# Patient Record
Sex: Male | Born: 1935 | Race: White | Hispanic: No | Marital: Married | Smoking: Former smoker
Health system: Southern US, Community
[De-identification: ages and names within clinical notes are randomized; demographics above are authoritative.]

## PROBLEM LIST (undated history)

## (undated) DIAGNOSIS — N400 Enlarged prostate without lower urinary tract symptoms: Secondary | ICD-10-CM

## (undated) DIAGNOSIS — I2510 Atherosclerotic heart disease of native coronary artery without angina pectoris: Secondary | ICD-10-CM

## (undated) DIAGNOSIS — K279 Peptic ulcer, site unspecified, unspecified as acute or chronic, without hemorrhage or perforation: Secondary | ICD-10-CM

## (undated) DIAGNOSIS — M4800 Spinal stenosis, site unspecified: Secondary | ICD-10-CM

## (undated) DIAGNOSIS — C61 Malignant neoplasm of prostate: Secondary | ICD-10-CM

## (undated) DIAGNOSIS — E039 Hypothyroidism, unspecified: Secondary | ICD-10-CM

## (undated) DIAGNOSIS — E785 Hyperlipidemia, unspecified: Secondary | ICD-10-CM

## (undated) DIAGNOSIS — I10 Essential (primary) hypertension: Secondary | ICD-10-CM

## (undated) DIAGNOSIS — I219 Acute myocardial infarction, unspecified: Secondary | ICD-10-CM

## (undated) HISTORY — PX: CORONARY ARTERY BYPASS GRAFT: SHX141

## (undated) HISTORY — PX: ANGIOPLASTY: SHX39

## (undated) HISTORY — DX: Benign prostatic hyperplasia without lower urinary tract symptoms: N40.0

## (undated) HISTORY — DX: Atherosclerotic heart disease of native coronary artery without angina pectoris: I25.10

## (undated) HISTORY — PX: KNEE SURGERY: SHX244

## (undated) HISTORY — DX: Malignant neoplasm of prostate: C61

## (undated) HISTORY — PX: VAGOTOMY: SUR1431

## (undated) HISTORY — DX: Hyperlipidemia, unspecified: E78.5

## (undated) HISTORY — DX: Essential (primary) hypertension: I10

## (undated) HISTORY — PX: CORONARY ANGIOPLASTY WITH STENT PLACEMENT: SHX49

## (undated) HISTORY — PX: GASTROJEJUNOSTOMY: SHX1697

## (undated) HISTORY — DX: Peptic ulcer, site unspecified, unspecified as acute or chronic, without hemorrhage or perforation: K27.9

## (undated) HISTORY — DX: Spinal stenosis, site unspecified: M48.00

## (undated) HISTORY — PX: BYPASS GRAFT: SHX909

## (undated) HISTORY — DX: Hypothyroidism, unspecified: E03.9

---

## 2003-04-30 ENCOUNTER — Encounter: Payer: Self-pay | Admitting: Cardiology

## 2003-04-30 ENCOUNTER — Ambulatory Visit (HOSPITAL_COMMUNITY): Admission: RE | Admit: 2003-04-30 | Discharge: 2003-04-30 | Payer: Self-pay | Admitting: Cardiology

## 2003-05-07 ENCOUNTER — Ambulatory Visit (HOSPITAL_COMMUNITY): Admission: RE | Admit: 2003-05-07 | Discharge: 2003-05-08 | Payer: Self-pay | Admitting: Cardiology

## 2004-02-29 ENCOUNTER — Encounter: Admission: RE | Admit: 2004-02-29 | Discharge: 2004-03-31 | Payer: Self-pay | Admitting: Orthopedic Surgery

## 2004-03-24 ENCOUNTER — Encounter: Admission: RE | Admit: 2004-03-24 | Discharge: 2004-03-24 | Payer: Self-pay | Admitting: Orthopedic Surgery

## 2004-05-04 ENCOUNTER — Ambulatory Visit (HOSPITAL_COMMUNITY): Admission: RE | Admit: 2004-05-04 | Discharge: 2004-05-06 | Payer: Self-pay | Admitting: Orthopedic Surgery

## 2004-05-04 ENCOUNTER — Encounter: Admission: RE | Admit: 2004-05-04 | Discharge: 2004-05-04 | Payer: Self-pay | Admitting: Orthopedic Surgery

## 2004-05-06 ENCOUNTER — Ambulatory Visit (HOSPITAL_BASED_OUTPATIENT_CLINIC_OR_DEPARTMENT_OTHER): Admission: RE | Admit: 2004-05-06 | Discharge: 2004-05-06 | Payer: Self-pay | Admitting: Orthopedic Surgery

## 2004-06-09 ENCOUNTER — Encounter: Admission: RE | Admit: 2004-06-09 | Discharge: 2004-09-01 | Payer: Self-pay | Admitting: Orthopedic Surgery

## 2005-06-30 ENCOUNTER — Emergency Department (HOSPITAL_COMMUNITY): Admission: EM | Admit: 2005-06-30 | Discharge: 2005-06-30 | Payer: Self-pay | Admitting: Emergency Medicine

## 2006-05-17 ENCOUNTER — Encounter: Admission: RE | Admit: 2006-05-17 | Discharge: 2006-05-17 | Payer: Self-pay | Admitting: Cardiology

## 2008-05-14 ENCOUNTER — Encounter: Admission: RE | Admit: 2008-05-14 | Discharge: 2008-05-14 | Payer: Self-pay | Admitting: Cardiology

## 2008-06-09 ENCOUNTER — Encounter: Admission: RE | Admit: 2008-06-09 | Discharge: 2008-06-09 | Payer: Self-pay | Admitting: Gastroenterology

## 2008-08-04 ENCOUNTER — Encounter: Admission: RE | Admit: 2008-08-04 | Discharge: 2008-08-04 | Payer: Self-pay | Admitting: Gastroenterology

## 2008-08-21 ENCOUNTER — Encounter: Admission: RE | Admit: 2008-08-21 | Discharge: 2008-08-21 | Payer: Self-pay | Admitting: Gastroenterology

## 2008-11-02 ENCOUNTER — Inpatient Hospital Stay (HOSPITAL_COMMUNITY): Admission: RE | Admit: 2008-11-02 | Discharge: 2008-11-09 | Payer: Self-pay | Admitting: General Surgery

## 2008-11-02 ENCOUNTER — Encounter (INDEPENDENT_AMBULATORY_CARE_PROVIDER_SITE_OTHER): Payer: Self-pay | Admitting: General Surgery

## 2009-10-19 ENCOUNTER — Ambulatory Visit (HOSPITAL_COMMUNITY): Admission: RE | Admit: 2009-10-19 | Discharge: 2009-10-19 | Payer: Self-pay | Admitting: Urology

## 2010-05-31 ENCOUNTER — Encounter: Admission: RE | Admit: 2010-05-31 | Discharge: 2010-05-31 | Payer: Self-pay | Admitting: Cardiology

## 2011-04-28 ENCOUNTER — Emergency Department (HOSPITAL_BASED_OUTPATIENT_CLINIC_OR_DEPARTMENT_OTHER)
Admission: EM | Admit: 2011-04-28 | Discharge: 2011-04-29 | Disposition: A | Discharge status: Home / Self Care | Payer: Medicare Other | Attending: Emergency Medicine | Admitting: Emergency Medicine

## 2011-04-28 ENCOUNTER — Emergency Department (INDEPENDENT_AMBULATORY_CARE_PROVIDER_SITE_OTHER): Payer: Medicare Other

## 2011-04-28 DIAGNOSIS — Z043 Encounter for examination and observation following other accident: Secondary | ICD-10-CM

## 2011-04-28 DIAGNOSIS — W010XXA Fall on same level from slipping, tripping and stumbling without subsequent striking against object, initial encounter: Secondary | ICD-10-CM

## 2011-04-28 DIAGNOSIS — W101XXA Fall (on)(from) sidewalk curb, initial encounter: Secondary | ICD-10-CM | POA: Insufficient documentation

## 2011-04-28 DIAGNOSIS — E1169 Type 2 diabetes mellitus with other specified complication: Secondary | ICD-10-CM | POA: Insufficient documentation

## 2011-04-28 DIAGNOSIS — I10 Essential (primary) hypertension: Secondary | ICD-10-CM | POA: Insufficient documentation

## 2011-04-28 DIAGNOSIS — IMO0002 Reserved for concepts with insufficient information to code with codable children: Secondary | ICD-10-CM

## 2011-04-28 LAB — GLUCOSE, CAPILLARY
Glucose-Capillary: 26 mg/dL — CL (ref 70–99)
Glucose-Capillary: 156 mg/dL — ABNORMAL HIGH (ref 70–99)
Glucose-Capillary: 34 mg/dL — CL (ref 70–99)

## 2011-05-02 NOTE — Op Note (Signed)
NAMEENOC, GETTER NO.:  [id number]   MEDICAL RECORD NO.:  [id number]          PATIENT TYPE:  INP   LOCATION:  3735                         FACILITY:  [facility]   PHYSICIAN:  Angelia Mould. Derrell Lolling, M.D.DATE OF BIRTH:  December 13, 1928   DATE OF PROCEDURE:  11/02/2008  DATE OF DISCHARGE:                               OPERATIVE REPORT   PREOPERATIVE DIAGNOSIS:  Peptic ulcer disease with post bulbar duodenal  stricture and obstruction.   POSTOPERATIVE DIAGNOSES:  1. Peptic ulcer disease with post bulbar duodenal stricture and      obstruction.  2. Benign cystic process of pancreatic body.   OPERATIONS PERFORMED:  1. Exploratory laparotomy.  2. Truncal vagotomy.  3. Retrocolic gastrojejunostomy.   SURGEON:  Angelia Mould. Derrell Lolling, MD   FIRST ASSISTANT:  Adolph Pollack, MD   OPERATIVE INDICATIONS:  This is a 77 year old white man who has coronary  artery disease, has had a coronary artery bypass, has diabetes, and  hyperlipidemia.  He takes aspirin and Plavix.  He has a long history of  peptic ulcer disease.  He has had progressive obstruction from a  postbulbar duodenal stricture.  Upper GI in June of this year showed a  ulcer or scar formation in the duodenal bulb and some narrowing but not  obstruction in the second portion of the duodenum.  He has had  progressive bloating and weight loss, and a recent CT scan showed  dilatation of the stomach and the duodenal bulb and some thickening of  the duodenum but no mass or cancer was seen.  There was no abnormality  of the pancreas on CT either.  Serum gastrin was 151, which is only  mildly elevated.  Recent endoscopy showed an active ulcer in the  proximal duodenum and a partially obstructing stricture in the second  portion of the duodenum, and Dr. Randa Evens was unable to pass the  stricture at this time.  An ultrasound showed a normal gallbladder.  I  have seen the patient as an outpatient.  He was offered a truncal  vagotomy and drainage procedure to surgically manage his intractable  ulcer disease.  He is brought to the operating room electively.   OPERATIVE FINDINGS:  We identified both the anterior and posterior vagus  nerves without difficulty.  The first, second, and third portions of the  duodenum were not dilated, and I did not feel a mass in the duodenum or  in the head of the pancreas.  I could not tell exactly where the  stricture was, but I suspected it was well distal to the first portion  of the duodenum.  The stomach itself appeared somewhat dilated and  hypertrophied.  There was no mass in the stomach.  There were multiple  benign appearing cyst on the anterior surface of the body of the  pancreas.  I reviewed this once again with the radiologist, and on CT,  these were not visible.  There was a 1-cm firm, tan nodule along the  right side of the gastrocolic omentum, which looked like an infarcted  piece of fat.  This was also excised for histologic confirmation.  The  liver and gallbladder felt and looked normal.  There was no sign of any  malignancy.  The spleen was of normal texture and size.  The colon had  lots of stool in it.   OPERATIVE TECHNIQUE:  Following the induction of general endotracheal  anesthesia, the patient's abdomen was prepped and draped in sterile  fashion.  Nasogastric tube and Foley catheter were inserted prior to the  abdominal prep.  The patient was identified as the correct patient and  correct procedure.  Intravenous antibiotics were given.  Upper midline  incision was made.  The fascia was incised in the midline.  Self-  retaining retractors were placed.  The abdomen was explored with  findings as described above.   Using electrocautery, I incised the peritoneum along the anterior  surface of the esophagus.  With blunt dissection, I fully mobilized the  esophagus away from the crura and placed a Penrose drain around this.  I  was able to palpate the  posterior vagus nerve.  I resected a segment of  the posterior vagus nerve between metal clips.  Frozen section confirmed  peripheral nerve.  I resected a fairly substantial anterior vagus nerve  as well resecting a segment at least 1 cm in length between metal clips.  Frozen section of this also confirmed anterior vagus nerve, peripheral  nerve.  Thirdly, I took stranding piece of tissue posteriorly between  metal clips.  Frozen section of that revealed blood vessel.  I took all  of the spider web nerves and strands to the left of the esophagus along  the crura and diaphragm to make sure that I got the criminal nerves.  Dr. Abbey Chatters checked my vagotomy and felt that I had done a complete  vagotomy.  There was no bleeding.  A pack was placed.   We divided the gastrocolic omentum using the LigaSure device.  We  examined the pancreas with findings as described above.  We reviewed the  CT scan with radiologist.  Dr. Abbey Chatters and I, both felt this was a  benign process that nothing needed to be done.  We found a small hard  nodule along the right side of the gastrocolic omentum and thought that  this was probably a benign peritoneal body.  We resected this and sent  it for routine histology.   We took down a little bit more of the gastrocolic omentum.  We then  elevated the transverse colon.  Using transillumination, we found an  avascular window in the transverse colon and opened this up with  electrocautery.  We identified the ligament of Treitz of the small bowel  and examined the small bowel fairly to thoroughly.  We brought a loop of  small bowel that was about 8 or 10 inches distal to the ligament of  Treitz up through the retrocolic window.  We sutured this with stay  sutures to the posterior wall of the greater curvature of the stomach.  An anastomosis was created between the posterior wall of the greater  curvature of the stomach and the antimesenteric wall of the proximal   jejunum using a GIA stapling device.  After this was done, examination  of the lumen showed a normal gastric and jejunal mucosa and no bleeding.  We closed the defect in the bowel with interrupted inverting sutures of  3-0 silk, placed about 15 or 20 such sutures during this transversely to  avoid narrowing of  the lumen.  We also placed a few other sutures along  the staple line to reinforce the staple line at critical points.  We  checked all the closures.  We could palpate at least a 2 fingerbreadths  of lumen between the stomach and the jejunum.  We brought the greater  curvature of the stomach down below the window in the transverse colon  mesentery.  We tacked the gastric wall to the transverse colon mesentery  with about 6 or 7 sutures of 3-0 silk and this allowed the loop of  jejunum to lay quite smoothly without any difficulty.  We irrigated out  all of the areas of dissection.  There was no bleeding at the  gastrojejunostomy.  There was no bleeding at the duodenal C-loop.  There  was no bleeding at the esophageal hiatus.  We positioned a nasogastric  tube one final time with the tip in the distal body.  After irrigating  one more time, we closed the midline fascia with a running suture of #1  double-stranded PDS and skin staples.  Clean bandages were placed, and  the patient taken to recovery room in stable condition.  Estimated blood  loss was about 75 mL or less.  Complications none.  Sponge, needle, and  instrument counts were correct.      Angelia Mould. Derrell Lolling, M.D.  Electronically Signed     HMI/MEDQ  D:  11/02/2008  T:  11/02/2008  Job:  [id number]   cc:   Fayrene Fearing L. Malon Kindle., M.D.  [name]. [name], M.D.  [name], M.D.

## 2011-05-05 NOTE — Cardiovascular Report (Signed)
NAME:  Jackson Mccormick, Jackson Mccormick NO.:  [id number]   MEDICAL RECORD NO.:  [id number]                   PATIENT TYPE:  OIB   LOCATION:  2852                                 FACILITY:  [facility]   PHYSICIAN:  W. Ashley Royalty., M.D.         DATE OF BIRTH:  Apr 26, 1928   DATE OF PROCEDURE:  04/30/2003  DATE OF DISCHARGE:                              CARDIAC CATHETERIZATION   HISTORY:  The patient is a 77 year old male who has a prior history of  coronary artery bypass grafting in January of 1998.  He had an abnormal  Cardiolite scan showing posterolateral ischemia and catheterization is  advised to exclude coronary graft disease.   </   COMMENTS ABOUT PROCEDURE:  The patient tolerated the procedure well without  complications and had good peripheral pulses present at the end of the  procedure and the right coronary graft was selected best using a right  coronary bypass catheter.  The remaining grafts were selected using the  standard right coronary catheter.  At the end of the procedure, the sheath  was removed and the films will be reviewed to determine further therapy,  after allowing time for recovery of her renal function and hydration.   HEMODYNAMIC DATA:  Aorta, post contrast -- 110/50; LV, post contrast --  110/16.   ANGIOGRAPHIC DATA:   LEFT VENTRICULOGRAM:  Performed in the 30 degree RAO projection.  The aortic  valve is normal.  The mitral valve is normal.  The left ventricle appears  normal in size.  There is hypokinesis of the distal anterior wall and apex  present.  The estimated ejection fraction is approximately 40-45%.  Coronary  arteries arise and distribute normally.  Significant calcification is noted  proximally in the left coronary system.   Left main coronary artery:  Distal 80% narrowing prior to the LAD and  circumflex.  The LAD has an 80-90% stenosis prior to the septal perforator,  then a tandem stenosis following it, and then is  totally occluded.  A small  diffusely diseased diagonal branch comes off in this segment.  The  circumflex coronary artery is diffusely diseased and is somewhat small.  The  obtuse marginal artery fills through the previous bypass graft.   Right coronary artery:  There is a severe 90% mid-vessel stenosis and a 90-  95% segmental eccentric distal stenosis.  The vessel is then diffusely  diseased and fills the posterolateral branch, both through antegrade flow as  well as through the previous saphenous vein graft.  There is also a series  of small posterolateral branches and posterior descending arteries which are  diffusely diseased, which fill both antegrade and through the bypass graft.   Saphenous vein graft to the obtuse marginal artery is widely patent.   Internal mammary graft to the LAD is widely patent.  The distal vessel is  somewhat small and diseased and fills a posterolateral branch  or posterior  descending artery by collateral filling.   Saphenous vein graft to the right coronary artery:  The graft has a severe  90% stenosis proximally and diffusely narrowed proximally, but is of good  caliber in the distal portion.  The anastomotic site appears patent.  There  is retrograde filling of the right coronary artery through this saphenous  vein graft system.    IMPRESSION:  1. Severe native three-vessel coronary artery disease and left main disease.  2. 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  3. Patent saphenous vein graft to the obtuse marginal artery and internal     mammary graft to the left anterior descending.   RECOMMENDATIONS:  Allow recovery of renal function.  Consider percutaneous  intervention either through the native right coronary artery or the proximal  portion of the saphenous vein graft.                                               Darden Palmer., M.D.     WST/MEDQ  D:  04/30/2003  T:  05/01/2003  Job:  [id number]   cc:   [name]. [name], M.D.  [address], [address]  [place]  [place] [address]  Fax: [phone]

## 2011-05-05 NOTE — Cardiovascular Report (Signed)
NAME:  Jackson Mccormick, Jackson Mccormick NO.:  [id number]   MEDICAL RECORD NO.:  [id number]                   PATIENT TYPE:  OIB   LOCATION:  6527                                 FACILITY:  [facility]   PHYSICIAN:  W. Ashley Royalty., M.D.         DATE OF BIRTH:  1928/10/22   DATE OF PROCEDURE:  05/07/2003  DATE OF DISCHARGE:                              CARDIAC CATHETERIZATION   PROCEDURES PERFORMED:  Cardiac catheterization an stent.   CARDIOLOGIST:  [name], M.D.   HISTORY:  [age]-year-old male who has a prior history of coronary  artery disease and bypass grafting.  He recently had posterolateral ischemia  demonstrated on catheterization.  He was brought back after hydration and  has stable renal function.  He is brought in at this time for intervention  of his saphenous vein graft.   DESCRIPTION OF PROCEDURE:  The patient was brought to the cath lab and was  prepped and draped in the usual manner.  After Xylocaine anesthesia a 7  French sheath was placed in the right femoral artery percutaneously.  Angiograms were made using a right coronary artery bypass graft catheter,  which was 7 [place].  It would tend to select into the ostium and resulted in  some dampening.  The catheter was positioned just outside the ostium.  Angiomax was begun with bolus and infusion.  A filter wire EX was deployed  across the stenosis with moderate difficulty and positioned in the distal  vessel.  Expansion of the basket was obtained in two views.  A 16 mm x 3.0  mm TAXUS drug eluding stent was then positioned and was deployed trying to  leave the upper edge of the balloon right a the ostium.  There was excellent  balloon position and just as the balloon was midway through the inflation  the apparatus appeared to shift backwards somewhat.  Because of some concern  that there may be stent protruding outside the ostium it was decided to  flare this with the larger  balloon.  The stent balloon was pulled back and  flared 18 atmospheres, and then the guiding catheter was drawn back inside  the stent while the balloon was still there.  This balloon was exchanged for  a 3.75 mm Quantum, which was then dilated to 14 atmospheres with the balloon  just inside the ostium.  Again, the guide catheter was pulled over the  balloon back into the stent and this balloon was removed, and postdilatation  angiograms were obtained.  The filter wire was then retrieved and did not  show any significant embolic material within the basket.  The patient  tolerated the procedure well during the these maneuvers.   Following this postdilatation angiograms were made with the apparatus now  showing excellent expansion of the proximal lesion.  The sheath was sutured  in place and he was returned to the angioplasty care center in  stable  condition.   ANGIOGRAPHIC DATA:  Bypass graft to the right coronary artery has a severe  concentric 95-99% stenosis proximally just inside the ostium.   Postdilatation angiograms fo the saphenous vein graft to the right coronary  artery revealed a smooth lesion with 0% residual stenosis.    IMPRESSION:  Successful stenting of the saphenous vein graft to the right  coronary artery with the stenosis going from 95% to 0%.                                                 Darden Palmer., M.D.    WST/MEDQ  D:  05/07/2003  T:  05/07/2003  Job:  [id number]   cc:   [name]. [name], M.D.  [address]  [place]  [place] [address]  Fax: [phone]

## 2011-05-05 NOTE — Discharge Summary (Signed)
NAME:  Jackson Mccormick, Jackson Mccormick NO.:  [id number]   MEDICAL RECORD NO.:  [id number]                   PATIENT TYPE:  OIB   LOCATION:  6527                                 FACILITY:  [facility]   PHYSICIAN:  W. Ashley Royalty., M.D.         DATE OF BIRTH:  May 08, 1928   DATE OF ADMISSION:  05/07/2003  DATE OF DISCHARGE:  05/08/2003                                 DISCHARGE SUMMARY   FINAL DIAGNOSES:  1. Coronary artery disease, status post coronary artery bypass grafting with     vein-graft stenosis.     A. Successful stenting of a vein-graft stenosis involving the right        coronary bypass graft.  2. Type 2 diabetes.  3. Hypertension.  4. Hyperlipidemia.   PROCEDURES:  Stenting of the vein graft to the right coronary artery with a  drug-eluding stent.   HISTORY:  This 77 year old male had recently had a cardiac catheterization  that showed a severe stenosis involving the bypass graft to the right  coronary artery.  He was asymptomatic but had ischemia on Cardiolite  testing.  He had never had angina, but because of a silent ischemia noted,  it was elected to bring him in for elective stenting of the vein graft to  the right coronary artery in order to try to preserve flow through the  graft.  Please see the previously dictated history and physical for  remainder of the details.   HOSPITAL COURSE:  Patient was brought into the hospital for a same-day  cardiac catheterization; this was done the same day.  He had a 16 mm x 3.0  mm TAXUS drug-eluding stent placed in the proximal right coronary graft.  Distal embolic protection with a filter wire was used during the procedure.  He tolerated the procedure well and had negative CPKs.  His EKG remained  unremarkable following it.  His renal function remained normal following the  procedure with a discharge creatinine of 1.2 and a BUN of 23.  He was  discharged in improved condition the next day.   DISCHARGE  MEDICATIONS:  1. Accupril 40 mg daily.  2. Aspirin 81 mg daily.  3. Atenolol 25 mg daily.  4. Crestor 10 mg daily.  5. Diovan 160 mg daily.  6. Folgard daily.  7. Humalog insulin 20 units in the morning, 25 before supper.  8. HCTZ 12.5 mg daily.  9. Lantus 10 units in the morning, 37 before supper.  10.      Plavix 75 mg daily.  11.      Synthroid 0.088 mg daily.  12.      Tri-Chlor 160 mg daily.  13.      Zyrtec 10 mg daily.    FOLLOW UP:  He is to follow up in one week and will be seen by the rehab  people prior to discharge.   DISCHARGE INSTRUCTIONS:  He  is to call if there are recurrent problems.                                               Darden Palmer., M.D.    WST/MEDQ  D:  05/08/2003  T:  05/08/2003  Job:  [id number]   cc:   [name]. [name], M.D.  [address], [address]  [place]  [place] [address]  Fax: [phone]

## 2011-05-05 NOTE — Op Note (Signed)
NAME:  DARON, STUTZ                           ACCOUNT NO.:  [id number]   MEDICAL RECORD NO.:  [id number]                   PATIENT TYPE:  AMB   LOCATION:  DSC                                  FACILITY:  [facility]   PHYSICIAN:  Almedia Balls. Ranell Patrick, M.D.              DATE OF BIRTH:  December 31, 1927   DATE OF PROCEDURE:  05/06/2004  DATE OF DISCHARGE:  05/06/2004                                 OPERATIVE REPORT   PREOPERATIVE DIAGNOSES:  1. Left knee medial meniscus tear.  2. Left knee quadriceps tendon tear.   POSTOPERATIVE DIAGNOSES:  1. Left knee medial meniscus tear.  2. Left knee quadriceps tendon tear.   PROCEDURE PERFORMED:  Left knee arthroscopy with debridement of posterior  horn medial meniscus tear, followed by open quadriceps tendon repair through  drill holes.   ATTENDING SURGEON:  Almedia Balls. Ranell Patrick, M.D.   FIRST ASSISTANT:  Maisie Fus B. Durwin Nora, P.A.   General anesthesia was used.   ESTIMATED BLOOD LOSS:  Minimal.   FLUIDS REPLACED:  1800 mL crystalloid.   Instrument count was correct.  There were no complications.   TOURNIQUET TIME:  45 minutes.   INDICATIONS:  The patient is a 77 year old male who sustained an injury to  the left knee.  The patient complains of medial-side joint line pain as well  as weakness with extension.  The patient's preoperative MRI scan  demonstrates a torn medial meniscus as well as a torn quadriceps tendon.  The patient was counseled regarding the need for operative repair of his  extensor mechanism to prevent falls secondary to weakness in his quadriceps  mechanism.  The patient understands and agrees and elected to proceed with  surgical treatment.  Informed consent was obtained.   DESCRIPTION OF THE OPERATION:  After an adequate level of anesthesia  achieved, the patient was positioned supine on the operating room table.  A  lateral post was utilized.  A sterile prep and drape was performed, followed  by arthroscopy of the knee through  standard portals.  These were created in  similar fashion with infiltration of the skin with 0.25% Marcaine with  epinephrine, followed by incision with an 11 blade scalpel, introduction of  cannula into the joint using blunt obturators.  Diagnostic arthroscopy  revealed a complex posterior horn medial meniscus tear that was debrided  back to stable meniscal tissue using a biting instrument and a full-radius  resector.  The lateral compartment showed no evidence of meniscal tear, ACL  and PCL intact.  Quadriceps noted to be torn on arthroscopy of the  suprapatellar pouch.  At this point the scope was concluded.  A midline  incision was created overlying the proximal pole of the patella and the  quadriceps.  This was done using a 10 blade scalpel after exsanguination of  the limb and using an Esmarch bandage and elevation of the tourniquet to 275  mmHg.  Dissection was carried sharply down through the subcutaneous tissues.  The quadriceps tear was obviously identified, and it was not retracting more  than a centimeter and was freshened up.  The patella was freshened up using  rongeur and curettes.  At this point three drill holes were placed proximal  to distal through the patella and #5 Fibrewire was woven up into the  quadriceps tendon in a baseball or Krakow suture technique.  The sutures  were taken back down through the patella and tied in a horizontal mattress  fashion under the tendon, gaining an excellent repair with the knee in  hyperextension.  The knee was flexed to 30 degrees, slight gapping was noted  at greater than 30 degrees.  The repair was reinforced using #1 Vicryl  suture in a running fashion.  Medial and lateral retinaculum was repaired  using a running #2 Fibrewire suture.  An outstanding repair was achieved.  At this point the subcutaneous tissue was closed using 2-0 Vicryl, followed  by 4-0 running Monocryl sutures for the skin.  Steri-Strips were applied,  followed  by a knee immobilizer.  The patient was taken to the recovery room  in stable condition.                                               Almedia Balls. Ranell Patrick, M.D.    SRN/MEDQ  D:  06/12/2004  T:  06/13/2004  Job:  [id number]

## 2011-05-05 NOTE — Op Note (Signed)
NAME:  Jackson Mccormick, Jackson Mccormick                           ACCOUNT NO.:  [id number]   MEDICAL RECORD NO.:  [id number]                   PATIENT TYPE:  OUT   LOCATION:  [facility]                                 FACILITY:  [facility]   PHYSICIAN:  Almedia Balls. Ranell Patrick, M.D.              DATE OF BIRTH:  1928/08/24   DATE OF PROCEDURE:  05/06/2004  DATE OF DISCHARGE:  05/06/2004                                 OPERATIVE REPORT   PREOPERATIVE DIAGNOSIS:  Left knee quadriceps tendon rupture, medial  meniscal tear.   POSTOPERATIVE DIAGNOSIS:  Left knee quadriceps tendon rupture, medial  meniscal tear.   OPERATION PERFORMED:  Left knee arthroscopy with debridement of posterior  horn medial meniscal tear as well as open repair of quadriceps tendon  rupture through drill holes.   SURGEON:  Almedia Balls. Ranell Patrick, M.D.   ASSISTANT:  Donnie Coffin. Durwin Nora, P.A.   ANESTHESIA:  General.   ESTIMATED BLOOD LOSS:  Minimal.   TOURNIQUET TIME:  40 minutes.   INSTRUMENT COUNT:  Correct.   COMPLICATIONS:  None.   ANTIBIOTICS:  Perioperative antibiotics given.   INDICATIONS FOR PROCEDURE:  The patient is a 77 year old male who presents  with an injury to the left knee.  The patient on MRI scan has a quadriceps  tendon rupture and a medial meniscal tear.  After discussing the options  with the patient, the patient desired surgical repair.   DESCRIPTION OF PROCEDURE:  After an adequate level of anesthesia was  achieved, patient positioned supine on the operating table.  A sterile  tourniquet was placed on the left proximal thigh, the left leg sterilely  prepped and draped.  Knee arthroscopy performed on the left knee first.  This was done through standard arthroscopic portals, superolateral outflow,  anterolateral scope and anteromedial working portals were created in a  similar fashion with infiltration of the skin with 0.25% Marcaine with  epinephrine followed by incision with an 11 blade scalpel, introduction of  the cannula  into the joint using blunt obturators.  Diagnostic arthroscopy  revealed a quadriceps tendon rupture from the joint side in the  suprapatellar pouch.  The patella looked to be in good shape with some  minimal chondromalacia.  The trochlear groove appeared to be in good shape  as well with minimal chondromalacia.  Medial and lateral gutters were free  of loose bodies or adhesions.  The medial compartment was entered. There was  noted to be a complex posterior horn medial meniscal tear which was debrided  back to stable meniscal tissue using biting instrument, full radius  resector.  Medial femoral condyle and medial tibial condyle cartilage showed  some mild grade two changes.  ACL and PCL were intact.  Lateral compartment  was pristine.  After completion of arthroscopy, a midline incision was  created to facilitate exposure to the quad tendon.  Complete rupture was  identified.  This was freshened up  and then repaired using two #5 FiberWire  sutures which were woven in a baseball stitch fashion in the quadriceps  tendon and then brought out and taken through three drill holes in the  patella.  Nice repair was obtained.  The medial retinaculum and lateral  retinaculum were resewed using #2 FiberWire suture in a running baseball  stitch technique and then the central portion of the tendon was oversewed  using #1 Vicryl followed by 2-0 Vicryl for subcutaneous and a 4-0 running  Monocryl for the skin.  Steri-Strips  were obtained.  There was no gapping  up to 40 degrees of flexion of the knee.  The patient tolerated the  procedure well and was taken to the recovery room in a knee immobilizer in  stable condition.                                               Almedia Balls. Ranell Patrick, M.D.    SRN/MEDQ  D:  05/18/2004  T:  05/18/2004  Job:  [id number]

## 2011-05-05 NOTE — Discharge Summary (Signed)
Jackson Mccormick, Jackson Mccormick NO.:  [id number]   MEDICAL RECORD NO.:  [id number]          PATIENT TYPE:  INP   LOCATION:  5126                         FACILITY:  [facility]   PHYSICIAN:  Angelia Mould. Derrell Lolling, M.D.DATE OF BIRTH:  1928-08-07   DATE OF ADMISSION:  11/02/2008  DATE OF DISCHARGE:  11/09/2008                               DISCHARGE SUMMARY   FINAL DIAGNOSES:  1. 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  2. Coronary artery disease, status post myocardial infarction and      coronary artery bypass grafting.  3. Type 2 diabetes.  4. Hyperlipidemia.   OPERATION PERFORMED:  1. Laparotomy.  2. Truncal vagotomy.  3. Retrocolic gastrojejunostomy.   DATE OF SURGERY:  November 02, 2008.   HISTORY:  This is a 77 year old white man. He does not have a long  history of peptic ulcer disease.  He has a 24-month history of bloating,  belching on his early satiety and nausea and has vomited only once.  He  has lots of audible bowel sounds, which has been getting worse.  He has  modified his diet, where he just eats soups to avoid symptoms.  He has  lost 12-15 pounds in the last year.  An upper GI was performed in June  2009, shows an ulcer or scar formation of the duodenal bulb.  Stomach  was somewhat distended and there was a narrow area in the proximal  descending duodenum.  Gallbladder ultrasound was normal.  Endoscopy 2  years ago showed a stricture of the second portion of the duodenum with  the scope was able to pass this area.  Because of progressive symptoms,  endoscopy was repeated recently in August 2009, which showed an active  ulcer in the proximal duodenum and a partial stricture in the second  portion of the duodenum.  He was taking proton pump inhibitors twice  daily.  Serum gastrin is 151, which is only borderline elevated.  CT  scan done in September 2009 showed clear distention of the stomach and a  caliber change  in descending duodenum.  Because of progressive symptoms,  it appears to be a high-grade partial obstruction of the descending  duodenum and the patient was sent to me and evaluated.  He was counseled  and offered vagotomy and drainage procedure.  He was in favor this.  He  stopped his aspirin and Plavix 5 days preoperatively and was brought to  the hospital electively.   HOSPITAL COURSE:  On the day of admission, he was taken to the operating  room and underwent laparotomy through an upper midline incision.  A  truncal vagotomy was performed.  Pathologic report showed that there was  nerve tissue in both specimens.  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  I could palpate the C-loop of the duodenum  and it was relatively soft with no mass effect anywhere, a little bit  scarred, but not badly.  We chose  to do a retrocolic gastrojejunostomy.   Postoperatively, the patient did well.  He was followed by Dr. Viann Fish.  He was also seen by Dr. Leslie Dales.  Blood sugars were  controlled with sliding scale insulin and Lantus insulin and that went  well.  His Foley catheter was removed on November 04, 2008.  His  nasogastric tube was removed on November 05, 2008.  We advanced his diet  slowly thereafter and converted him to oral medications over the next  couple of days.  He had a few loose stools early in the transition to  oral diet, but no cramps or diaphoresis.  This improved and then he was  placed on a postgastrectomy diet.  He was ready for discharge on  November 09, 2008, that time the diarrhea had resolved.  He was  tolerating solid food and wanted to go home, was not having any  bloating.  Abdomen exam was unremarkable.  The wounds were healing  uneventfully.  He was instructed postgastrectomy diet and told to adhere  to that for at least a couple of weeks.  Given him a prescription for   Vicodin for pain.  He was told to continue all of his usual medications.  Staples were removed.  Steri-Strips were applied.  He was asked to  return to see me in the office in 2-3 weeks.      Angelia Mould. Derrell Lolling, M.D.  Electronically Signed     HMI/MEDQ  D:  12/02/2008  T:  12/02/2008  Job:  [id number]   cc:   Fayrene Fearing L. Malon Kindle., M.D.  [name]. [name], M.D.  [name], M.D.

## 2011-05-31 ENCOUNTER — Other Ambulatory Visit: Payer: Self-pay | Admitting: *Deleted

## 2011-05-31 DIAGNOSIS — M549 Dorsalgia, unspecified: Secondary | ICD-10-CM

## 2011-06-07 ENCOUNTER — Ambulatory Visit
Admission: RE | Admit: 2011-06-07 | Discharge: 2011-06-07 | Disposition: A | Discharge status: Home / Self Care | Payer: PRIVATE HEALTH INSURANCE | Source: Ambulatory Visit | Attending: *Deleted | Admitting: *Deleted

## 2011-06-07 DIAGNOSIS — M549 Dorsalgia, unspecified: Secondary | ICD-10-CM

## 2011-06-09 ENCOUNTER — Other Ambulatory Visit: Payer: Self-pay | Admitting: Cardiology

## 2011-06-09 ENCOUNTER — Ambulatory Visit
Admission: RE | Admit: 2011-06-09 | Discharge: 2011-06-09 | Disposition: A | Discharge status: Home / Self Care | Payer: Medicare Other | Source: Ambulatory Visit | Attending: Cardiology | Admitting: Cardiology

## 2011-06-09 DIAGNOSIS — R609 Edema, unspecified: Secondary | ICD-10-CM

## 2011-07-28 ENCOUNTER — Other Ambulatory Visit: Payer: Self-pay | Admitting: Oncology

## 2011-07-28 ENCOUNTER — Encounter (HOSPITAL_BASED_OUTPATIENT_CLINIC_OR_DEPARTMENT_OTHER): Payer: Medicare Other | Admitting: Oncology

## 2011-07-28 DIAGNOSIS — C61 Malignant neoplasm of prostate: Secondary | ICD-10-CM

## 2011-07-28 LAB — CBC WITH DIFFERENTIAL/PLATELET
Basophils Absolute: 0.1 10*3/uL (ref 0.0–0.1)
Eosinophils Absolute: 0.5 10*3/uL (ref 0.0–0.5)
HCT: 29.7 % — ABNORMAL LOW (ref 38.4–49.9)
HGB: 10.1 g/dL — ABNORMAL LOW (ref 13.0–17.1)
LYMPH%: 12.8 % — ABNORMAL LOW (ref 14.0–49.0)
MCV: 90.2 fL (ref 79.3–98.0)
MONO%: 9.1 % (ref 0.0–14.0)
NEUT#: 4.4 10*3/uL (ref 1.5–6.5)
Platelets: 210 10*3/uL (ref 140–400)

## 2011-07-28 LAB — COMPREHENSIVE METABOLIC PANEL
Albumin: 3.6 g/dL (ref 3.5–5.2)
Alkaline Phosphatase: 42 U/L (ref 39–117)
BUN: 13 mg/dL (ref 6–23)
Glucose, Bld: 156 mg/dL — ABNORMAL HIGH (ref 70–99)
Potassium: 4.6 mEq/L (ref 3.5–5.3)
Total Bilirubin: 0.4 mg/dL (ref 0.3–1.2)

## 2011-07-28 LAB — TESTOSTERONE: Testosterone: <10 ng/dL — ABNORMAL LOW (ref 250–890)

## 2011-09-19 LAB — BASIC METABOLIC PANEL
BUN: 8
CO2: 27
CO2: 27
Calcium: 8.2 — ABNORMAL LOW
Chloride: 99
GFR calc Af Amer: >60
GFR calc non Af Amer: 59 — ABNORMAL LOW
Glucose, Bld: 137 — ABNORMAL HIGH
Glucose, Bld: 155 — ABNORMAL HIGH
Potassium: 3.9
Potassium: 4.3
Sodium: 128 — ABNORMAL LOW
Sodium: 132 — ABNORMAL LOW
Sodium: 136

## 2011-09-19 LAB — CBC
HCT: 29.8 — ABNORMAL LOW
HCT: 30.7 — ABNORMAL LOW
HCT: 31.7 — ABNORMAL LOW
HCT: 35.4 — ABNORMAL LOW
Hemoglobin: 10.4 — ABNORMAL LOW
Hemoglobin: 10.6 — ABNORMAL LOW
Hemoglobin: 10.9 — ABNORMAL LOW
MCHC: 34.4
MCHC: 34.6
MCHC: 35
MCV: 92
MCV: 93.4
Platelets: 155
Platelets: 163
Platelets: 197
RBC: 3.19 — ABNORMAL LOW
RBC: 3.33 — ABNORMAL LOW
RBC: 3.36 — ABNORMAL LOW
RDW: 13.3
RDW: 13.7
WBC: 4.5
WBC: 6.4

## 2011-09-19 LAB — GLUCOSE, CAPILLARY
Glucose-Capillary: 104 — ABNORMAL HIGH
Glucose-Capillary: 106 — ABNORMAL HIGH
Glucose-Capillary: 129 — ABNORMAL HIGH
Glucose-Capillary: 134 — ABNORMAL HIGH
Glucose-Capillary: 134 — ABNORMAL HIGH
Glucose-Capillary: 135 — ABNORMAL HIGH
Glucose-Capillary: 136 — ABNORMAL HIGH
Glucose-Capillary: 136 — ABNORMAL HIGH
Glucose-Capillary: 139 — ABNORMAL HIGH
Glucose-Capillary: 140 — ABNORMAL HIGH
Glucose-Capillary: 142 — ABNORMAL HIGH
Glucose-Capillary: 146 — ABNORMAL HIGH
Glucose-Capillary: 164 — ABNORMAL HIGH
Glucose-Capillary: 165 — ABNORMAL HIGH
Glucose-Capillary: 170 — ABNORMAL HIGH
Glucose-Capillary: 173 — ABNORMAL HIGH
Glucose-Capillary: 184 — ABNORMAL HIGH
Glucose-Capillary: 87
Glucose-Capillary: 90

## 2011-09-19 LAB — COMPREHENSIVE METABOLIC PANEL
ALT: 18
AST: 26
Albumin: 3.6
Alkaline Phosphatase: 59
BUN: 15
Chloride: 101
Potassium: 4.2
Sodium: 135
Total Bilirubin: 0.4

## 2011-09-19 LAB — URINALYSIS, ROUTINE W REFLEX MICROSCOPIC
Bilirubin Urine: NEGATIVE
Ketones, ur: NEGATIVE
Nitrite: NEGATIVE
Urobilinogen, UA: 0.2

## 2011-09-19 LAB — DIFFERENTIAL
Basophils Absolute: 0.1
Basophils Relative: 1
Eosinophils Absolute: 0.2
Eosinophils Relative: 2
Monocytes Absolute: 0.5
Neutro Abs: 4.8

## 2011-10-11 ENCOUNTER — Encounter (HOSPITAL_BASED_OUTPATIENT_CLINIC_OR_DEPARTMENT_OTHER): Payer: Medicare Other | Admitting: Oncology

## 2011-10-11 ENCOUNTER — Other Ambulatory Visit: Payer: Self-pay | Admitting: Oncology

## 2011-10-11 ENCOUNTER — Encounter: Payer: Self-pay | Admitting: *Deleted

## 2011-10-11 DIAGNOSIS — C61 Malignant neoplasm of prostate: Secondary | ICD-10-CM

## 2011-10-11 LAB — COMPREHENSIVE METABOLIC PANEL
Albumin: 3.5 g/dL (ref 3.5–5.2)
Alkaline Phosphatase: 60 U/L (ref 39–117)
BUN: 15 mg/dL (ref 6–23)
Glucose, Bld: 131 mg/dL — ABNORMAL HIGH (ref 70–99)
Potassium: 5.2 mEq/L (ref 3.5–5.3)

## 2011-10-11 LAB — CBC WITH DIFFERENTIAL/PLATELET
Basophils Absolute: 0 10*3/uL (ref 0.0–0.1)
HCT: 32 % — ABNORMAL LOW (ref 38.4–49.9)
HGB: 10.8 g/dL — ABNORMAL LOW (ref 13.0–17.1)
LYMPH%: 17.7 % (ref 14.0–49.0)
MCHC: 33.8 g/dL (ref 32.0–36.0)
MONO#: 0.5 10*3/uL (ref 0.1–0.9)
NEUT%: 66.2 % (ref 39.0–75.0)
Platelets: 268 10*3/uL (ref 140–400)
WBC: 6 10*3/uL (ref 4.0–10.3)
lymph#: 1.1 10*3/uL (ref 0.9–3.3)

## 2011-10-11 LAB — PSA: PSA: 1.91 ng/mL (ref <=4.00)

## 2011-12-27 ENCOUNTER — Other Ambulatory Visit: Payer: Self-pay | Admitting: Oncology

## 2011-12-27 ENCOUNTER — Other Ambulatory Visit (HOSPITAL_BASED_OUTPATIENT_CLINIC_OR_DEPARTMENT_OTHER): Payer: Medicare Other | Admitting: Lab

## 2011-12-27 ENCOUNTER — Ambulatory Visit (HOSPITAL_BASED_OUTPATIENT_CLINIC_OR_DEPARTMENT_OTHER): Payer: Medicare Other | Admitting: Oncology

## 2011-12-27 ENCOUNTER — Telehealth: Payer: Self-pay | Admitting: Oncology

## 2011-12-27 VITALS — BP 144/70 | HR 59 | Temp 98.1°F | Ht 67.0 in | Wt 160.4 lb

## 2011-12-27 DIAGNOSIS — C61 Malignant neoplasm of prostate: Secondary | ICD-10-CM

## 2011-12-27 DIAGNOSIS — C7952 Secondary malignant neoplasm of bone marrow: Secondary | ICD-10-CM

## 2011-12-27 DIAGNOSIS — C7951 Secondary malignant neoplasm of bone: Secondary | ICD-10-CM

## 2011-12-27 LAB — COMPREHENSIVE METABOLIC PANEL
ALT: 16 U/L (ref 0–53)
Albumin: 3.5 g/dL (ref 3.5–5.2)
CO2: 24 mEq/L (ref 19–32)
Calcium: 8.6 mg/dL (ref 8.4–10.5)
Chloride: 105 mEq/L (ref 96–112)
Glucose, Bld: 128 mg/dL — ABNORMAL HIGH (ref 70–99)
Potassium: 5.1 mEq/L (ref 3.5–5.3)
Sodium: 138 mEq/L (ref 135–145)
Total Protein: 5.6 g/dL — ABNORMAL LOW (ref 6.0–8.3)

## 2011-12-27 LAB — CBC WITH DIFFERENTIAL/PLATELET
Eosinophils Absolute: 0.2 10*3/uL (ref 0.0–0.5)
LYMPH%: 23 % (ref 14.0–49.0)
MONO#: 0.6 10*3/uL (ref 0.1–0.9)
NEUT#: 3.5 10*3/uL (ref 1.5–6.5)
Platelets: 203 10*3/uL (ref 140–400)
RBC: 3.72 10*6/uL — ABNORMAL LOW (ref 4.20–5.82)
RDW: 13.9 % (ref 11.0–14.6)
WBC: 5.7 10*3/uL (ref 4.0–10.3)
lymph#: 1.3 10*3/uL (ref 0.9–3.3)

## 2011-12-27 NOTE — Progress Notes (Signed)
Hematology and Oncology Follow Up Visit  Jackson Mccormick [id number] 01-21-1928 78 y.o. 12/27/2011 3:38 PM  CC: Excell Seltzer. [name], M.D.  [name], M.D.  [name]. [name], M.D.    Principle Diagnosis: This is an 78 year old Asian gentleman with prostate cancer initially diagnosed Gleason score 4 + 5 = 9 in October 2010 with a PSA of 223.  He has advanced bony metastasis.    Prior Therapy:  1. The patient was treated with hormonal deprivation, initially with Deborra Medina therapy and subsequently to [employer].  He had an excellent response, PSA dropped from 223 down to 0.08. 2. His PSA started to rise as high as 0.54 in May 2012. 3. Casodex was started around August 2012.  Current therapy: Trelstar as well as monthly Zometa (recently switched to [place]) . Casodex was stopped in 12/2011 due to a rise in PSA   Interim History: Jackson Mccormick presents today for a followup visit.  Since the last time I saw him, he is not reporting any major problems related to his prostate cancer.  As a matter of fact, he is not reporting any back pain that he had before.  He is not reporting any major complications related to his cancer.  Had not had any pathological fractures.  Had not had any hospitalizations or illnesses.  His appetite and performance status are excellent at this time. He have stopped casodex in the last week or so. He is asymptomatic.   Medications: I have reviewed the patient's current medications. Current outpatient prescriptions:amLODipine (NORVASC) 5 MG tablet, Take 5 mg by mouth daily.  , Disp: , Rfl: ;  aspirin 81 MG tablet, Take 81 mg by mouth daily.  , Disp: , Rfl: ;  atenolol (TENORMIN) 25 MG tablet, Take 25 mg by mouth daily.  , Disp: , Rfl: ;  Calcium Carbonate-Vitamin D (CALCIUM + D PO), Take 1 tablet by mouth 2 (two) times daily. Cal 600 mg and Vit D 200 IU , Disp: , Rfl:  Cholecalciferol (VITAMIN D-3 PO), Take 2,000 Units by mouth daily.  , Disp: , Rfl: ;  clopidogrel (PLAVIX) 75 MG  tablet, Take 75 mg by mouth daily.  , Disp: , Rfl: ;  fenofibrate (TRICOR) 145 MG tablet, Take 145 mg by mouth daily.  , Disp: , Rfl: ;  fluticasone (FLONASE) 50 MCG/ACT nasal spray, Place 4 sprays into the nose daily.  , Disp: , Rfl:  Folic Acid-Vit B6-Vit B12 (FOLGARD) 0.8-10-0.115 MG TABS, Take 1 tablet by mouth daily.  , Disp: , Rfl: ;  insulin glargine (LANTUS) 100 UNIT/ML injection, Inject 13 Units into the skin at bedtime.  , Disp: , Rfl: ;  insulin lispro (HUMALOG) 100 UNIT/ML injection, Inject 1-5 Units into the skin 3 (three) times daily before meals. , Disp: , Rfl:  ipratropium (ATROVENT) 0.03 % nasal spray, Place 4 sprays into the nose every 12 (twelve) hours.  , Disp: , Rfl: ;  levocetirizine (XYZAL) 5 MG tablet, Take 5 mg by mouth every evening.  , Disp: , Rfl: ;  levothyroxine (SYNTHROID, LEVOTHROID) 175 MCG tablet, Take 175 mcg by mouth daily.  , Disp: , Rfl: ;  pantoprazole (PROTONIX) 40 MG tablet, Take 40 mg by mouth daily.  , Disp: , Rfl:  quinapril (ACCUPRIL) 40 MG tablet, Take 40 mg by mouth at bedtime.  , Disp: , Rfl: ;  rosuvastatin (CRESTOR) 10 MG tablet, Take 10 mg by mouth daily.  , Disp: , Rfl: ;  simethicone (MYLICON) 125  MG chewable tablet, Chew 125 mg by mouth 3 (three) times daily as needed.  , Disp: , Rfl: ;  traMADol-acetaminophen (ULTRACET) 37.5-325 MG per tablet, Take 1 tablet by mouth every 6 (six) hours as needed.  , Disp: , Rfl:  Triptorelin Pamoate (TRELSTAR DEPOT IM), Inject into the muscle every 3 (three) months.  , Disp: , Rfl: ;  Trospium Chloride (SANCTURA XR PO), Take 60 mg by mouth daily.  , Disp: , Rfl: ;  valsartan-hydrochlorothiazide (DIOVAN-HCT) 320-25 MG per tablet, Take 1 tablet by mouth daily.  , Disp: , Rfl: ;  vitamin C (ASCORBIC ACID) 500 MG tablet, Take 500 mg by mouth daily.  , Disp: , Rfl:  Zoledronic Acid (ZOMETA IV), Inject into the vein every 30 (thirty) days.  , Disp: , Rfl:   Allergies:  Allergies  Allergen Reactions  . Morphine And Related       Past Medical History, Surgical history, Social history, and Family History were reviewed and updated.  Review of Systems: Constitutional:  Negative for fever, chills, night sweats, anorexia, weight loss, pain. Cardiovascular: no chest pain or dyspnea on exertion Respiratory: no cough, shortness of breath, or wheezing Neurological: no TIA or stroke symptoms Dermatological: negative ENT: negative Skin: Negative. Gastrointestinal: no abdominal pain, change in bowel habits, or black or bloody stools Genito-Urinary: no dysuria, trouble voiding, or hematuria Hematological and Lymphatic: negative Breast: negative Musculoskeletal: negative Remaining ROS negative. Physical Exam: Blood pressure 144/70, pulse 59, temperature 98.1 F (36.7 C), temperature source Oral, height 5\' 7"  (1.702 m), weight 160 lb 6.4 oz (72.757 kg). ECOG: 1 General appearance: alert Head: Normocephalic, without obvious abnormality, atraumatic Neck: no adenopathy, no carotid bruit, no JVD, supple, symmetrical, trachea midline and thyroid not enlarged, symmetric, no tenderness/mass/nodules Lymph nodes: Cervical, supraclavicular, and axillary nodes normal. Heart:regular rate and rhythm, S1, S2 normal, no murmur, click, rub or gallop Lung:chest clear, no wheezing, rales, normal symmetric air entry Abdomin: soft, non-tender, without masses or organomegaly EXT:no erythema, induration, or nodules   Lab Results: Lab Results  Component Value Date   WBC 5.7 12/27/2011   HGB 11.3* 12/27/2011   HCT 33.7* 12/27/2011   MCV 90.6 12/27/2011   PLT 203 12/27/2011     Chemistry      Component Value Date/Time   NA 138 10/11/2011 1433   NA 138 10/11/2011 1433   K 5.2 10/11/2011 1433   K 5.2 10/11/2011 1433   CL 104 10/11/2011 1433   CL 104 10/11/2011 1433   CO2 25 10/11/2011 1433   CO2 25 10/11/2011 1433   BUN 15 10/11/2011 1433   BUN 15 10/11/2011 1433   CREATININE 1.36* 10/11/2011 1433   CREATININE 1.36* 10/11/2011 1433       Component Value Date/Time   CALCIUM 9.2 10/11/2011 1433   CALCIUM 9.2 10/11/2011 1433   ALKPHOS 60 10/11/2011 1433   ALKPHOS 60 10/11/2011 1433   AST 24 10/11/2011 1433   AST 24 10/11/2011 1433   ALT 13 10/11/2011 1433   ALT 13 10/11/2011 1433   BILITOT 0.4 10/11/2011 1433   BILITOT 0.4 10/11/2011 1433         Impression and Plan:  This is a pleasant 78 year old gentleman with the following issues:  1. 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   Casodex was added around August 2012.  Now he is on Casodex withdrawal and second line hormonal manipulations: I discussed and risks and benefits of  ketoconazole and prednisone versus Zytiga. I educated him on  drug- drug interactions, hepatotoxicity as well as GI complications. He willing to proceed with ketoconazole.  2. Bony disease.  I agree with continued Xgeva on a monthly basis.  Eli Hose, MD 1/9/20133:38 PM

## 2011-12-27 NOTE — Telephone Encounter (Signed)
gve the pt his march 2013 appt calendar °

## 2011-12-28 ENCOUNTER — Other Ambulatory Visit: Payer: Self-pay | Admitting: *Deleted

## 2012-01-15 ENCOUNTER — Other Ambulatory Visit: Payer: Self-pay | Admitting: Cardiology

## 2012-02-21 ENCOUNTER — Ambulatory Visit (HOSPITAL_BASED_OUTPATIENT_CLINIC_OR_DEPARTMENT_OTHER): Payer: Medicare Other | Admitting: Oncology

## 2012-02-21 ENCOUNTER — Other Ambulatory Visit (HOSPITAL_BASED_OUTPATIENT_CLINIC_OR_DEPARTMENT_OTHER): Payer: Medicare Other | Admitting: Lab

## 2012-02-21 ENCOUNTER — Telehealth: Payer: Self-pay | Admitting: Oncology

## 2012-02-21 VITALS — BP 133/63 | HR 55 | Temp 96.8°F | Ht 67.0 in | Wt 165.4 lb

## 2012-02-21 DIAGNOSIS — C61 Malignant neoplasm of prostate: Secondary | ICD-10-CM

## 2012-02-21 DIAGNOSIS — C7952 Secondary malignant neoplasm of bone marrow: Secondary | ICD-10-CM

## 2012-02-21 LAB — CBC WITH DIFFERENTIAL/PLATELET
Basophils Absolute: 0 10*3/uL (ref 0.0–0.1)
EOS%: 1.9 % (ref 0.0–7.0)
Eosinophils Absolute: 0.2 10*3/uL (ref 0.0–0.5)
HCT: 33.7 % — ABNORMAL LOW (ref 38.4–49.9)
HGB: 11.2 g/dL — ABNORMAL LOW (ref 13.0–17.1)
MCH: 31.1 pg (ref 27.2–33.4)
MCV: 93.8 fL (ref 79.3–98.0)
MONO%: 5.3 % (ref 0.0–14.0)
NEUT#: 6.6 10*3/uL — ABNORMAL HIGH (ref 1.5–6.5)
NEUT%: 82.4 % — ABNORMAL HIGH (ref 39.0–75.0)
Platelets: 191 10*3/uL (ref 140–400)

## 2012-02-21 LAB — COMPREHENSIVE METABOLIC PANEL
AST: 35 U/L (ref 0–37)
Albumin: 3.5 g/dL (ref 3.5–5.2)
Alkaline Phosphatase: 63 U/L (ref 39–117)
BUN: 11 mg/dL (ref 6–23)
Calcium: 8.9 mg/dL (ref 8.4–10.5)
Chloride: 103 mEq/L (ref 96–112)
Creatinine, Ser: 1.12 mg/dL (ref 0.50–1.35)
Glucose, Bld: 92 mg/dL (ref 70–99)

## 2012-02-21 NOTE — Telephone Encounter (Signed)
03/27/12 appt made and printed for pt     aom

## 2012-02-21 NOTE — Progress Notes (Signed)
Hematology and Oncology Follow Up Visit  Jackson Mccormick [id number] 04-10-1928 78 y.o. 02/21/2012 3:24 PM  CC: Excell Seltzer. [name], M.D.  [name], M.D.  [name]. [name], M.D.    Principle Diagnosis: This is an 78 year old Asian gentleman with prostate cancer initially diagnosed Gleason score 4 + 5 = 9 in October 2010 with a PSA of 223.  He has advanced bony metastasis.    Prior Therapy:  1. The patient was treated with hormonal deprivation, initially with Deborra Medina therapy and subsequently to [employer].  He had an excellent response, PSA dropped from 223 down to 0.08. 2. His PSA started to rise as high as 0.54 in May 2012. 3. Casodex was started around August 2012.  Current therapy: Trelstar as well as monthly Zometa (recently switched to [place]) . Casodex was stopped in 12/2011 due to a rise in PSA. Ketoconazole 200 mg BID with prednisone 5 mg daily started in in 1/ 2013   Interim History: Mr. Stueve presents today for a followup visit.  Since the last time I saw him, he is not reporting any major problems related to his prostate cancer.  As a matter of fact, he is not reporting any back pain that he had before.  He is not reporting any major complications related to his cancer.  Had not had any pathological fractures.  Had not had any hospitalizations or illnesses.  His appetite and performance status are excellent at this time. He have stopped casodex in the last week or so. He is asymptomatic. He is doing well with Ketoconazole and prednisone without complications.    Medications: I have reviewed the patient's current medications. Current outpatient prescriptions:amLODipine (NORVASC) 5 MG tablet, Take 5 mg by mouth daily.  , Disp: , Rfl: ;  aspirin 81 MG tablet, Take 81 mg by mouth daily.  , Disp: , Rfl: ;  atenolol (TENORMIN) 25 MG tablet, Take 25 mg by mouth daily.  , Disp: , Rfl: ;  Calcium Carbonate-Vitamin D (CALCIUM + D PO), Take 1 tablet by mouth 2 (two) times daily. Cal 600 mg  and Vit D 200 IU , Disp: , Rfl:  Cholecalciferol (VITAMIN D-3 PO), Take 2,000 Units by mouth daily.  , Disp: , Rfl: ;  clopidogrel (PLAVIX) 75 MG tablet, Take 75 mg by mouth daily.  , Disp: , Rfl: ;  fenofibrate (TRICOR) 145 MG tablet, Take 145 mg by mouth daily.  , Disp: , Rfl: ;  fluticasone (FLONASE) 50 MCG/ACT nasal spray, Place 4 sprays into the nose daily.  , Disp: , Rfl:  Folic Acid-Vit B6-Vit B12 (FOLGARD) 0.8-10-0.115 MG TABS, Take 1 tablet by mouth daily.  , Disp: , Rfl: ;  insulin glargine (LANTUS) 100 UNIT/ML injection, Inject 13 Units into the skin at bedtime.  , Disp: , Rfl: ;  insulin lispro (HUMALOG) 100 UNIT/ML injection, Inject 1-5 Units into the skin 3 (three) times daily before meals. , Disp: , Rfl:  ipratropium (ATROVENT) 0.03 % nasal spray, Place 4 sprays into the nose every 12 (twelve) hours.  , Disp: , Rfl: ;  ketoconazole (NIZORAL) 200 MG tablet, Take 200 mg by mouth 2 (two) times daily., Disp: , Rfl: ;  levocetirizine (XYZAL) 5 MG tablet, Take 5 mg by mouth every evening.  , Disp: , Rfl: ;  levothyroxine (SYNTHROID, LEVOTHROID) 175 MCG tablet, Take 175 mcg by mouth daily.  , Disp: , Rfl:  pantoprazole (PROTONIX) 40 MG tablet, Take 40 mg by mouth daily.  , Disp: ,  Rfl: ;  predniSONE (DELTASONE) 5 MG tablet, Take 5 mg by mouth daily., Disp: , Rfl: ;  quinapril (ACCUPRIL) 40 MG tablet, Take 40 mg by mouth at bedtime.  , Disp: , Rfl: ;  rosuvastatin (CRESTOR) 10 MG tablet, Take 10 mg by mouth daily.  , Disp: , Rfl: ;  simethicone (MYLICON) 125 MG chewable tablet, Chew 125 mg by mouth 3 (three) times daily as needed.  , Disp: , Rfl:  traMADol-acetaminophen (ULTRACET) 37.5-325 MG per tablet, Take 1 tablet by mouth every 6 (six) hours as needed.  , Disp: , Rfl: ;  Triptorelin Pamoate (TRELSTAR DEPOT IM), Inject into the muscle every 3 (three) months.  , Disp: , Rfl: ;  Trospium Chloride (SANCTURA XR PO), Take 60 mg by mouth daily.  , Disp: , Rfl: ;  valsartan-hydrochlorothiazide (DIOVAN-HCT)  320-25 MG per tablet, Take 1 tablet by mouth daily.  , Disp: , Rfl:  vitamin C (ASCORBIC ACID) 500 MG tablet, Take 500 mg by mouth daily.  , Disp: , Rfl: ;  Zoledronic Acid (ZOMETA IV), Inject into the vein every 30 (thirty) days.  , Disp: , Rfl:   Allergies:  Allergies  Allergen Reactions  . Morphine And Related     Past Medical History, Surgical history, Social history, and Family History were reviewed and updated.  Review of Systems: Constitutional:  Negative for fever, chills, night sweats, anorexia, weight loss, pain. Cardiovascular: no chest pain or dyspnea on exertion Respiratory: no cough, shortness of breath, or wheezing Neurological: no TIA or stroke symptoms Dermatological: negative ENT: negative Skin: Negative. Gastrointestinal: no abdominal pain, change in bowel habits, or black or bloody stools Genito-Urinary: no dysuria, trouble voiding, or hematuria Hematological and Lymphatic: negative Breast: negative Musculoskeletal: negative Remaining ROS negative. Physical Exam: Blood pressure 133/63, pulse 55, temperature 96.8 F (36 C), temperature source Oral, height 5\' 7"  (1.702 m), weight 165 lb 6.4 oz (75.025 kg). ECOG: 1 General appearance: alert Head: Normocephalic, without obvious abnormality, atraumatic Neck: no adenopathy, no carotid bruit, no JVD, supple, symmetrical, trachea midline and thyroid not enlarged, symmetric, no tenderness/mass/nodules Lymph nodes: Cervical, supraclavicular, and axillary nodes normal. Heart:regular rate and rhythm, S1, S2 normal, no murmur, click, rub or gallop Lung:chest clear, no wheezing, rales, normal symmetric air entry Abdomin: soft, non-tender, without masses or organomegaly EXT:no erythema, induration, or nodules   Lab Results: Lab Results  Component Value Date   WBC 8.0 02/21/2012   HGB 11.2* 02/21/2012   HCT 33.7* 02/21/2012   MCV 93.8 02/21/2012   PLT 191 02/21/2012     Chemistry      Component Value Date/Time   NA 138  12/27/2011 1429   NA 138 12/27/2011 1429   K 5.1 12/27/2011 1429   K 5.1 12/27/2011 1429   CL 105 12/27/2011 1429   CL 105 12/27/2011 1429   CO2 24 12/27/2011 1429   CO2 24 12/27/2011 1429   BUN 14 12/27/2011 1429   BUN 14 12/27/2011 1429   CREATININE 1.39* 12/27/2011 1429   CREATININE 1.39* 12/27/2011 1429      Component Value Date/Time   CALCIUM 8.6 12/27/2011 1429   CALCIUM 8.6 12/27/2011 1429   ALKPHOS 55 12/27/2011 1429   ALKPHOS 55 12/27/2011 1429   AST 24 12/27/2011 1429   AST 24 12/27/2011 1429   ALT 16 12/27/2011 1429   ALT 16 12/27/2011 1429   BILITOT 0.3 12/27/2011 1429   BILITOT 0.3 12/27/2011 1429         Impression  and Plan:  This is a pleasant 78 year old gentleman with the following issues:  1. 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   Casodex was added around August 2012.  Now he is on Ketoconazole as second line hormonal manipulations. He tolerated it well with out complications. PSA  Is pending from today. If PSA is responding, then will continue the current medication and schedule. IF PSA rises again, then will consider a different agent such as Zytiga.  2. Bony disease.  I agree with continued Xgeva on a monthly basis. 3. Hormonal deprivation: He is to continue on Trelstar.   [facility], MD 3/6/20133:24 PM

## 2012-03-26 ENCOUNTER — Telehealth: Payer: Self-pay | Admitting: Oncology

## 2012-03-26 NOTE — Telephone Encounter (Signed)
Pt called today to r/s 4/10 appt to 4/17.

## 2012-03-27 ENCOUNTER — Other Ambulatory Visit: Payer: Medicare Other

## 2012-03-27 ENCOUNTER — Ambulatory Visit: Payer: Medicare Other | Admitting: Oncology

## 2012-04-03 ENCOUNTER — Other Ambulatory Visit (HOSPITAL_BASED_OUTPATIENT_CLINIC_OR_DEPARTMENT_OTHER): Payer: Medicare Other | Admitting: Lab

## 2012-04-03 ENCOUNTER — Telehealth: Payer: Self-pay | Admitting: Oncology

## 2012-04-03 ENCOUNTER — Ambulatory Visit (HOSPITAL_BASED_OUTPATIENT_CLINIC_OR_DEPARTMENT_OTHER): Payer: Medicare Other | Admitting: Oncology

## 2012-04-03 VITALS — BP 116/65 | HR 62 | Temp 97.3°F | Ht 67.0 in | Wt 163.9 lb

## 2012-04-03 DIAGNOSIS — C7952 Secondary malignant neoplasm of bone marrow: Secondary | ICD-10-CM

## 2012-04-03 DIAGNOSIS — C61 Malignant neoplasm of prostate: Secondary | ICD-10-CM

## 2012-04-03 LAB — COMPREHENSIVE METABOLIC PANEL
AST: 26 U/L (ref 0–37)
Albumin: 3.5 g/dL (ref 3.5–5.2)
Alkaline Phosphatase: 48 U/L (ref 39–117)
BUN: 12 mg/dL (ref 6–23)
Calcium: 9.2 mg/dL (ref 8.4–10.5)
Chloride: 102 mEq/L (ref 96–112)
Glucose, Bld: 170 mg/dL — ABNORMAL HIGH (ref 70–99)
Potassium: 4.4 mEq/L (ref 3.5–5.3)
Sodium: 138 mEq/L (ref 135–145)
Total Protein: 6.1 g/dL (ref 6.0–8.3)

## 2012-04-03 LAB — CBC WITH DIFFERENTIAL/PLATELET
Basophils Absolute: 0.1 10*3/uL (ref 0.0–0.1)
Eosinophils Absolute: 0.1 10*3/uL (ref 0.0–0.5)
HGB: 11.3 g/dL — ABNORMAL LOW (ref 13.0–17.1)
MCV: 94.2 fL (ref 79.3–98.0)
MONO#: 0.7 10*3/uL (ref 0.1–0.9)
MONO%: 9.3 % (ref 0.0–14.0)
NEUT#: 5.1 10*3/uL (ref 1.5–6.5)
RBC: 3.63 10*6/uL — ABNORMAL LOW (ref 4.20–5.82)
RDW: 14.1 % (ref 11.0–14.6)
WBC: 7.1 10*3/uL (ref 4.0–10.3)
nRBC: 0 % (ref 0–0)

## 2012-04-03 NOTE — Progress Notes (Signed)
Hematology and Oncology Follow Up Visit  BATU CASSIN [id number] 1928/07/09 78 y.o. 04/03/2012 3:43 PM  CC: Excell Seltzer. [name], M.D.  [name], M.D.  [name]. [name], M.D.    Principle Diagnosis: This is an 78 year old Asian gentleman with prostate cancer initially diagnosed Gleason score 4 + 5 = 9 in October 2010 with a PSA of 223.  He has advanced bony metastasis.    Prior Therapy:  1. The patient was treated with hormonal deprivation, initially with Deborra Medina therapy and subsequently to [employer].  He had an excellent response, PSA dropped from 223 down to 0.08. 2. His PSA started to rise as high as 0.54 in May 2012. 3. Casodex was started around August 2012.  Current therapy: Trelstar as well as monthly Zometa (recently switched to [place]) . Casodex was stopped in 12/2011 due to a rise in PSA. Ketoconazole 200 mg BID with prednisone 5 mg daily started in in 1/ 2013   Interim History: Mr. Mofield presents today for a followup visit.  Since the last time I saw him, he is not reporting any major problems related to his prostate cancer or ketoconazole.  As a matter of fact, he is not reporting any back pain that he had before.  He is not reporting any major complications related to his cancer.  Had not had any pathological fractures.  Had not had any hospitalizations or illnesses.  His appetite and performance status are excellent at this time. He have stopped casodex in the last week or so. He is asymptomatic. He is doing well with Ketoconazole and prednisone without complications.  No recent hospitalizations or illnesses.    Medications: I have reviewed the patient's current medications. Current outpatient prescriptions:amLODipine (NORVASC) 5 MG tablet, Take 5 mg by mouth daily.  , Disp: , Rfl: ;  aspirin 81 MG tablet, Take 81 mg by mouth daily.  , Disp: , Rfl: ;  atenolol (TENORMIN) 25 MG tablet, Take 25 mg by mouth daily.  , Disp: , Rfl: ;  Calcium Carbonate-Vitamin D (CALCIUM + D  PO), Take 1 tablet by mouth 2 (two) times daily. Cal 600 mg and Vit D 200 IU , Disp: , Rfl:  Cholecalciferol (VITAMIN D-3 PO), Take 2,000 Units by mouth daily.  , Disp: , Rfl: ;  clopidogrel (PLAVIX) 75 MG tablet, Take 75 mg by mouth daily.  , Disp: , Rfl: ;  fenofibrate (TRICOR) 145 MG tablet, Take 145 mg by mouth daily.  , Disp: , Rfl: ;  fluticasone (FLONASE) 50 MCG/ACT nasal spray, Place 4 sprays into the nose daily.  , Disp: , Rfl:  Folic Acid-Vit B6-Vit B12 (FOLGARD) 0.8-10-0.115 MG TABS, Take 1 tablet by mouth daily.  , Disp: , Rfl: ;  insulin glargine (LANTUS) 100 UNIT/ML injection, Inject 13 Units into the skin at bedtime.  , Disp: , Rfl: ;  insulin lispro (HUMALOG) 100 UNIT/ML injection, Inject 1-5 Units into the skin 3 (three) times daily before meals. , Disp: , Rfl:  ipratropium (ATROVENT) 0.03 % nasal spray, Place 4 sprays into the nose every 12 (twelve) hours.  , Disp: , Rfl: ;  ketoconazole (NIZORAL) 200 MG tablet, Take 200 mg by mouth 2 (two) times daily., Disp: , Rfl: ;  levocetirizine (XYZAL) 5 MG tablet, Take 5 mg by mouth every evening.  , Disp: , Rfl: ;  levothyroxine (SYNTHROID, LEVOTHROID) 175 MCG tablet, Take 175 mcg by mouth daily.  , Disp: , Rfl:  pantoprazole (PROTONIX) 40 MG tablet, Take  40 mg by mouth daily.  , Disp: , Rfl: ;  predniSONE (DELTASONE) 5 MG tablet, Take 5 mg by mouth daily., Disp: , Rfl: ;  quinapril (ACCUPRIL) 40 MG tablet, Take 40 mg by mouth at bedtime.  , Disp: , Rfl: ;  rosuvastatin (CRESTOR) 10 MG tablet, Take 10 mg by mouth daily.  , Disp: , Rfl: ;  simethicone (MYLICON) 125 MG chewable tablet, Chew 125 mg by mouth 3 (three) times daily as needed.  , Disp: , Rfl:  traMADol-acetaminophen (ULTRACET) 37.5-325 MG per tablet, Take 1 tablet by mouth every 6 (six) hours as needed.  , Disp: , Rfl: ;  Triptorelin Pamoate (TRELSTAR DEPOT IM), Inject into the muscle every 3 (three) months.  , Disp: , Rfl: ;  Trospium Chloride (SANCTURA XR PO), Take 60 mg by mouth daily.  ,  Disp: , Rfl: ;  valsartan-hydrochlorothiazide (DIOVAN-HCT) 320-25 MG per tablet, Take 1 tablet by mouth daily.  , Disp: , Rfl:  vitamin C (ASCORBIC ACID) 500 MG tablet, Take 500 mg by mouth daily.  , Disp: , Rfl: ;  Zoledronic Acid (ZOMETA IV), Inject into the vein every 30 (thirty) days.  , Disp: , Rfl:   Allergies:  Allergies  Allergen Reactions  . Morphine And Related     Past Medical History, Surgical history, Social history, and Family History were reviewed and updated.  Review of Systems: Constitutional:  Negative for fever, chills, night sweats, anorexia, weight loss, pain. Cardiovascular: no chest pain or dyspnea on exertion Respiratory: no cough, shortness of breath, or wheezing Neurological: no TIA or stroke symptoms Dermatological: negative ENT: negative Skin: Negative. Gastrointestinal: no abdominal pain, change in bowel habits, or black or bloody stools Genito-Urinary: no dysuria, trouble voiding, or hematuria Hematological and Lymphatic: negative Breast: negative Musculoskeletal: negative Remaining ROS negative. Physical Exam: Blood pressure 116/65, pulse 62, temperature 97.3 F (36.3 C), temperature source Oral, height 5\' 7"  (1.702 m), weight 163 lb 14.4 oz (74.345 kg). ECOG: 1 General appearance: alert Head: Normocephalic, without obvious abnormality, atraumatic Neck: no adenopathy, no carotid bruit, no JVD, supple, symmetrical, trachea midline and thyroid not enlarged, symmetric, no tenderness/mass/nodules Lymph nodes: Cervical, supraclavicular, and axillary nodes normal. Heart:regular rate and rhythm, S1, S2 normal, no murmur, click, rub or gallop Lung:chest clear, no wheezing, rales, normal symmetric air entry Abdomin: soft, non-tender, without masses or organomegaly EXT:no erythema, induration, or nodules   Lab Results: Lab Results  Component Value Date   WBC 7.1 04/03/2012   HGB 11.3* 04/03/2012   HCT 34.2* 04/03/2012   MCV 94.2 04/03/2012   PLT 189  04/03/2012     Chemistry      Component Value Date/Time   NA 139 02/21/2012 1455   K 4.0 02/21/2012 1455   CL 103 02/21/2012 1455   CO2 28 02/21/2012 1455   BUN 11 02/21/2012 1455   CREATININE 1.12 02/21/2012 1455      Component Value Date/Time   CALCIUM 8.9 02/21/2012 1455   ALKPHOS 63 02/21/2012 1455   AST 35 02/21/2012 1455   ALT 32 02/21/2012 1455   BILITOT 0.4 02/21/2012 1455         Impression and Plan:  This is a pleasant 78 year old gentleman with the following issues:  1. 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   Casodex was added around August 2012.  Now he is on Ketoconazole as second line hormonal manipulations. He tolerated it well with out complications. PSA  Is pending from today. If PSA is responding,  then will continue the current medication and schedule. IF PSA rises again, then will consider a different agent such as Zytiga.  2. Bony disease.  I agree with continued Xgeva on a monthly basis. 3. Hormonal deprivation: He is to continue on Trelstar.   [facility], MD 4/17/20133:43 PM

## 2012-04-03 NOTE — Telephone Encounter (Signed)
appts made and printed for pt  °

## 2012-05-07 ENCOUNTER — Telehealth: Payer: Self-pay | Admitting: Oncology

## 2012-05-07 ENCOUNTER — Ambulatory Visit (HOSPITAL_BASED_OUTPATIENT_CLINIC_OR_DEPARTMENT_OTHER): Payer: Medicare Other | Admitting: Oncology

## 2012-05-07 ENCOUNTER — Other Ambulatory Visit (HOSPITAL_BASED_OUTPATIENT_CLINIC_OR_DEPARTMENT_OTHER): Payer: Medicare Other | Admitting: Lab

## 2012-05-07 VITALS — BP 124/65 | HR 57 | Temp 97.8°F | Wt 164.4 lb

## 2012-05-07 DIAGNOSIS — C61 Malignant neoplasm of prostate: Secondary | ICD-10-CM

## 2012-05-07 LAB — CBC WITH DIFFERENTIAL/PLATELET
BASO%: 1.2 % (ref 0.0–2.0)
Basophils Absolute: 0.1 10*3/uL (ref 0.0–0.1)
EOS%: 4 % (ref 0.0–7.0)
Eosinophils Absolute: 0.3 10*3/uL (ref 0.0–0.5)
HCT: 33.5 % — ABNORMAL LOW (ref 38.4–49.9)
HGB: 11.1 g/dL — ABNORMAL LOW (ref 13.0–17.1)
LYMPH%: 13.7 % — ABNORMAL LOW (ref 14.0–49.0)
MCH: 31.3 pg (ref 27.2–33.4)
MCHC: 33.1 g/dL (ref 32.0–36.0)
MCV: 94.5 fL (ref 79.3–98.0)
MONO#: 0.8 10*3/uL (ref 0.1–0.9)
MONO%: 9.3 % (ref 0.0–14.0)
NEUT#: 6.2 10*3/uL (ref 1.5–6.5)
NEUT%: 71.8 % (ref 39.0–75.0)
Platelets: 176 10*3/uL (ref 140–400)
RBC: 3.55 10*6/uL — ABNORMAL LOW (ref 4.20–5.82)
RDW: 13.5 % (ref 11.0–14.6)
WBC: 8.6 10*3/uL (ref 4.0–10.3)
lymph#: 1.2 10*3/uL (ref 0.9–3.3)
nRBC: 0 % (ref 0–0)

## 2012-05-07 LAB — COMPREHENSIVE METABOLIC PANEL
ALT: 16 U/L (ref 0–53)
AST: 20 U/L (ref 0–37)
Alkaline Phosphatase: 38 U/L — ABNORMAL LOW (ref 39–117)
Creatinine, Ser: 1.11 mg/dL (ref 0.50–1.35)
Sodium: 139 mEq/L (ref 135–145)
Total Bilirubin: 0.4 mg/dL (ref 0.3–1.2)
Total Protein: 5.6 g/dL — ABNORMAL LOW (ref 6.0–8.3)

## 2012-05-07 LAB — PSA: PSA: 2.04 ng/mL (ref <=4.00)

## 2012-05-07 NOTE — Progress Notes (Signed)
Hematology and Oncology Follow Up Visit  Jackson Mccormick [id number] [birth year] 78 y.o. 05/07/2012 1:45 PM  CC: Excell Seltzer. [name], M.D.  [name], M.D.  [name]. [name], M.D.    Principle Diagnosis: This is an 78 year old Asian gentleman with prostate cancer initially diagnosed Gleason score 4 + 5 = 9 in October 2010 with a PSA of 223.  He has advanced bony metastasis.    Prior Therapy:  1. The patient was treated with hormonal deprivation, initially with Deborra Medina therapy and subsequently to [employer].  He had an excellent response, PSA dropped from 223 down to 0.08. 2. His PSA started to rise as high as 0.54 in May 2012. 3. Casodex was started around August 2012.  Current therapy: Trelstar as well as monthly Zometa (recently switched to [place]) . Casodex was stopped in 12/2011 due to a rise in PSA. Ketoconazole 200 mg BID with prednisone 5 mg daily started in in 1/ 2013   Interim History: Jackson Mccormick presents today for a followup visit.  Since the last time I saw him, he is not reporting any major problems related to his prostate cancer or ketoconazole.  He did report vesicular rash in his left inner thigh. It was biopsied by his dermatologist and the biopsy changes suggestive of lymphedema. He is not reporting any major complications related to his cancer.  Had not had any pathological fractures.  Had not had any hospitalizations or illnesses.  His appetite and performance status are excellent at this time. He have stopped casodex in the last week or so. He is asymptomatic. He is doing well with Ketoconazole and prednisone without complications. No recent hospitalizations or illnesses.    Medications: I have reviewed the patient's current medications. Current outpatient prescriptions:amLODipine (NORVASC) 5 MG tablet, Take 5 mg by mouth daily.  , Disp: , Rfl: ;  aspirin 81 MG tablet, Take 81 mg by mouth daily.  , Disp: , Rfl: ;  atenolol (TENORMIN) 25 MG tablet, Take 25 mg by mouth daily.   , Disp: , Rfl: ;  Calcium Carbonate-Vitamin D (CALCIUM + D PO), Take 1 tablet by mouth 2 (two) times daily. Cal 600 mg and Vit D 200 IU , Disp: , Rfl:  Cholecalciferol (VITAMIN D-3 PO), Take 2,000 Units by mouth daily.  , Disp: , Rfl: ;  clopidogrel (PLAVIX) 75 MG tablet, Take 75 mg by mouth daily.  , Disp: , Rfl: ;  fluticasone (FLONASE) 50 MCG/ACT nasal spray, Place 4 sprays into the nose daily.  , Disp: , Rfl: ;  Folic Acid-Vit B6-Vit B12 (FOLGARD) 0.8-10-0.115 MG TABS, Take 1 tablet by mouth daily.  , Disp: , Rfl:  insulin glargine (LANTUS) 100 UNIT/ML injection, Inject 16 Units into the skin at bedtime. , Disp: , Rfl: ;  insulin lispro (HUMALOG) 100 UNIT/ML injection, Inject 6-15 Units into the skin 2 (two) times daily with a meal. Before breakfast and dinner., Disp: , Rfl: ;  ipratropium (ATROVENT) 0.03 % nasal spray, Place 4 sprays into the nose every 12 (twelve) hours.  , Disp: , Rfl:  ketoconazole (NIZORAL) 200 MG tablet, Take 200 mg by mouth 2 (two) times daily., Disp: , Rfl: ;  levocetirizine (XYZAL) 5 MG tablet, Take 5 mg by mouth every evening.  , Disp: , Rfl: ;  levothyroxine (SYNTHROID, LEVOTHROID) 175 MCG tablet, Take 200 mcg by mouth daily. , Disp: , Rfl: ;  pantoprazole (PROTONIX) 40 MG tablet, Take 40 mg by mouth daily.  , Disp: , Rfl: ;  predniSONE (DELTASONE) 5 MG tablet, Take 5 mg by mouth daily., Disp: , Rfl:  rosuvastatin (CRESTOR) 10 MG tablet, Take 10 mg by mouth daily.  , Disp: , Rfl: ;  simethicone (MYLICON) 125 MG chewable tablet, Chew 125 mg by mouth 3 (three) times daily as needed.  , Disp: , Rfl: ;  traMADol-acetaminophen (ULTRACET) 37.5-325 MG per tablet, Take 1 tablet by mouth every 8 (eight) hours as needed. , Disp: , Rfl: ;  Triptorelin Pamoate (TRELSTAR DEPOT IM), Inject into the muscle every 3 (three) months.  , Disp: , Rfl:  Trospium Chloride (SANCTURA XR PO), Take 60 mg by mouth daily.  , Disp: , Rfl: ;  valsartan-hydrochlorothiazide (DIOVAN-HCT) 320-25 MG per tablet,  Take 1 tablet by mouth daily.  , Disp: , Rfl: ;  vitamin C (ASCORBIC ACID) 500 MG tablet, Take 500 mg by mouth daily.  , Disp: , Rfl: ;  Zoledronic Acid (ZOMETA IV), Inject into the vein every 30 (thirty) days.  , Disp: , Rfl:   Allergies:  Allergies  Allergen Reactions  . Morphine And Related     Past Medical History, Surgical history, Social history, and Family History were reviewed and updated.  Review of Systems: Constitutional:  Negative for fever, chills, night sweats, anorexia, weight loss, pain. Cardiovascular: no chest pain or dyspnea on exertion Respiratory: no cough, shortness of breath, or wheezing Neurological: no TIA or stroke symptoms Dermatological: negative ENT: negative Skin: Negative. Gastrointestinal: no abdominal pain, change in bowel habits, or black or bloody stools Genito-Urinary: no dysuria, trouble voiding, or hematuria Hematological and Lymphatic: negative Breast: negative Musculoskeletal: negative Remaining ROS negative. Physical Exam: Blood pressure 124/65, pulse 57, temperature 97.8 F (36.6 C), temperature source Oral, weight 164 lb 7 oz (74.588 kg). ECOG: 1 General appearance: alert Head: Normocephalic, without obvious abnormality, atraumatic Neck: no adenopathy, no carotid bruit, no JVD, supple, symmetrical, trachea midline and thyroid not enlarged, symmetric, no tenderness/mass/nodules Lymph nodes: Cervical, supraclavicular, and axillary nodes normal. Heart:regular rate and rhythm, S1, S2 normal, no murmur, click, rub or gallop Lung:chest clear, no wheezing, rales, normal symmetric air entry Abdomin: soft, non-tender, without masses or organomegaly EXT:no erythema, induration, or nodules. Left thigh: vesicular rash noted. All lesions are crusted. No edema noted.    Lab Results: Lab Results  Component Value Date   WBC 8.6 05/07/2012   HGB 11.1* 05/07/2012   HCT 33.5* 05/07/2012   MCV 94.5 05/07/2012   PLT 176 05/07/2012     Chemistry        Component Value Date/Time   NA 138 04/03/2012 1450   K 4.4 04/03/2012 1450   CL 102 04/03/2012 1450   CO2 29 04/03/2012 1450   BUN 12 04/03/2012 1450   CREATININE 1.24 04/03/2012 1450      Component Value Date/Time   CALCIUM 9.2 04/03/2012 1450   ALKPHOS 48 04/03/2012 1450   AST 26 04/03/2012 1450   ALT 18 04/03/2012 1450   BILITOT 0.3 04/03/2012 1450      Results for Jackson Mccormick, Jackson Mccormick (MRN [id number]) as of 05/07/2012 13:17  Ref. Range 02/21/2012 14:55 04/03/2012 14:50  PSA Latest Range: <=4.00 ng/mL 4.21 (H) 2.97     Impression and Plan:  This is a pleasant 78 year old gentleman with the following issues:  1. 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   Now he is on Ketoconazole as second line hormonal manipulations since 12/2011. He tolerated it well with out complications. PSA  Is pending from today. If PSA is responding,  then will continue the current medication and schedule. IF PSA rises again, then will consider a different agent such as Zytiga. Last PSA down to 2.97. 2. Bony disease.  I agree with continued Xgeva on a monthly basis. 3. Hormonal deprivation: He is to continue on Trelstar.  4. Left thigh lesions with questionable lymphedema. I will obtain CT scan of abdomin and pelvis to rule out lymphatic obstruction.   Eli Hose, MD 5/21/20131:45 PM

## 2012-05-07 NOTE — Telephone Encounter (Signed)
appts made and printed for pt aom °

## 2012-05-14 ENCOUNTER — Ambulatory Visit (HOSPITAL_COMMUNITY)
Admission: RE | Admit: 2012-05-14 | Discharge: 2012-05-14 | Disposition: A | Discharge status: Home / Self Care | Payer: Medicare Other | Source: Ambulatory Visit | Attending: Oncology | Admitting: Oncology

## 2012-05-14 DIAGNOSIS — I7789 Other specified disorders of arteries and arterioles: Secondary | ICD-10-CM | POA: Insufficient documentation

## 2012-05-14 DIAGNOSIS — Z79899 Other long term (current) drug therapy: Secondary | ICD-10-CM | POA: Insufficient documentation

## 2012-05-14 DIAGNOSIS — N200 Calculus of kidney: Secondary | ICD-10-CM | POA: Insufficient documentation

## 2012-05-14 DIAGNOSIS — C7952 Secondary malignant neoplasm of bone marrow: Secondary | ICD-10-CM | POA: Insufficient documentation

## 2012-05-14 DIAGNOSIS — C7951 Secondary malignant neoplasm of bone: Secondary | ICD-10-CM | POA: Insufficient documentation

## 2012-05-14 DIAGNOSIS — C61 Malignant neoplasm of prostate: Secondary | ICD-10-CM | POA: Insufficient documentation

## 2012-05-14 DIAGNOSIS — J8410 Pulmonary fibrosis, unspecified: Secondary | ICD-10-CM | POA: Insufficient documentation

## 2012-05-14 MED ORDER — IOHEXOL 300 MG/ML  SOLN
100.0000 mL | Freq: Once | INTRAMUSCULAR | Status: AC | PRN
  Administered 2012-05-14: 100 mL via INTRAVENOUS

## 2012-06-07 ENCOUNTER — Ambulatory Visit (HOSPITAL_BASED_OUTPATIENT_CLINIC_OR_DEPARTMENT_OTHER): Payer: Medicare Other | Admitting: Oncology

## 2012-06-07 ENCOUNTER — Telehealth: Payer: Self-pay | Admitting: Oncology

## 2012-06-07 ENCOUNTER — Other Ambulatory Visit (HOSPITAL_BASED_OUTPATIENT_CLINIC_OR_DEPARTMENT_OTHER): Payer: Medicare Other | Admitting: Lab

## 2012-06-07 VITALS — BP 122/60 | HR 60 | Temp 97.1°F | Ht 67.0 in | Wt 170.7 lb

## 2012-06-07 DIAGNOSIS — C61 Malignant neoplasm of prostate: Secondary | ICD-10-CM

## 2012-06-07 DIAGNOSIS — J8410 Pulmonary fibrosis, unspecified: Secondary | ICD-10-CM

## 2012-06-07 LAB — COMPREHENSIVE METABOLIC PANEL
ALT: 18 U/L (ref 0–53)
Albumin: 3.8 g/dL (ref 3.5–5.2)
CO2: 28 mEq/L (ref 19–32)
Calcium: 9.3 mg/dL (ref 8.4–10.5)
Chloride: 101 mEq/L (ref 96–112)
Creatinine, Ser: 1.34 mg/dL (ref 0.50–1.35)
Potassium: 5.2 mEq/L (ref 3.5–5.3)
Sodium: 136 mEq/L (ref 135–145)
Total Protein: 6.7 g/dL (ref 6.0–8.3)

## 2012-06-07 LAB — CBC WITH DIFFERENTIAL/PLATELET
BASO%: 0.8 % (ref 0.0–2.0)
HCT: 35.9 % — ABNORMAL LOW (ref 38.4–49.9)
MCHC: 32.9 g/dL (ref 32.0–36.0)
MONO#: 0.8 10*3/uL (ref 0.1–0.9)
NEUT%: 74.2 % (ref 39.0–75.0)
RDW: 13.7 % (ref 11.0–14.6)
WBC: 9.6 10*3/uL (ref 4.0–10.3)
lymph#: 1.4 10*3/uL (ref 0.9–3.3)

## 2012-06-07 NOTE — Progress Notes (Signed)
Hematology and Oncology Follow Up Visit  Jackson Mccormick [id number] 24-Nov-1928 78 y.o. 06/07/2012 3:47 PM  CC: Excell Seltzer. [name], M.D.  [name], M.D.  [name]. [name], M.D.    Principle Diagnosis: This is an 78 year old Asian gentleman with prostate cancer initially diagnosed Gleason score 4 + 5 = 9 in October 2010 with a PSA of 223.  He has advanced bony metastasis.    Prior Therapy:  1. The patient was treated with hormonal deprivation, initially with Deborra Medina therapy and subsequently to [employer].  He had an excellent response, PSA dropped from 223 down to 0.08. 2. His PSA started to rise as high as 0.54 in May 2012. 3. Casodex was started around August 2012.  Current therapy: Trelstar as well as monthly Zometa (recently switched to Basco) . Casodex was stopped in 12/2011 due to a rise in PSA. Ketoconazole 200 mg BID with prednisone 5 mg daily started in in 1/ 2013   Interim History: Mr. Chance presents today for a followup visit.  Since the last time I saw him, he is not reporting any major problems related to his prostate cancer or ketoconazole.  He did report vesicular rash in his left inner thigh. It was biopsied by his dermatologist and the biopsy changes suggestive of lymphedema. He is not reporting any major complications related to his cancer.  Had not had any pathological fractures.  Had not had any hospitalizations or illnesses.  His appetite and performance status are excellent at this time. He have stopped casodex in the last week or so. He is asymptomatic. He is doing well with Ketoconazole and prednisone without complications. No recent hospitalizations or illnesses.  The blisters on his inner thigh are unchanged.   Medications: I have reviewed the patient's current medications. Current outpatient prescriptions:amLODipine (NORVASC) 5 MG tablet, Take 5 mg by mouth daily.  , Disp: , Rfl: ;  aspirin 81 MG tablet, Take 81 mg by mouth daily.  , Disp: , Rfl: ;  atenolol  (TENORMIN) 25 MG tablet, Take 25 mg by mouth daily.  , Disp: , Rfl: ;  Calcium Carbonate-Vitamin D (CALCIUM + D PO), Take 1 tablet by mouth 2 (two) times daily. Cal 600 mg and Vit D 200 IU , Disp: , Rfl:  Cholecalciferol (VITAMIN D-3 PO), Take 2,000 Units by mouth daily.  , Disp: , Rfl: ;  clopidogrel (PLAVIX) 75 MG tablet, Take 75 mg by mouth daily.  , Disp: , Rfl: ;  fluticasone (FLONASE) 50 MCG/ACT nasal spray, Place 4 sprays into the nose daily.  , Disp: , Rfl: ;  Folic Acid-Vit B6-Vit B12 (FOLGARD) 0.8-10-0.115 MG TABS, Take 1 tablet by mouth daily.  , Disp: , Rfl:  insulin glargine (LANTUS) 100 UNIT/ML injection, Inject 17 Units into the skin 2 (two) times daily. , Disp: , Rfl: ;  insulin lispro (HUMALOG) 100 UNIT/ML injection, Inject 6-15 Units into the skin 2 (two) times daily with a meal. Before breakfast and dinner., Disp: , Rfl: ;  ipratropium (ATROVENT) 0.03 % nasal spray, Place 4 sprays into the nose every 12 (twelve) hours.  , Disp: , Rfl:  ketoconazole (NIZORAL) 200 MG tablet, Take 200 mg by mouth 2 (two) times daily., Disp: , Rfl: ;  levocetirizine (XYZAL) 5 MG tablet, Take 5 mg by mouth every evening.  , Disp: , Rfl: ;  levothyroxine (SYNTHROID, LEVOTHROID) 175 MCG tablet, Take 200 mcg by mouth daily. Alternate 200mg  and 175mg  daily., Disp: , Rfl: ;  pantoprazole (PROTONIX)  40 MG tablet, Take 40 mg by mouth daily.  , Disp: , Rfl:  predniSONE (DELTASONE) 5 MG tablet, Take 5 mg by mouth daily., Disp: , Rfl: ;  rosuvastatin (CRESTOR) 10 MG tablet, Take 10 mg by mouth daily.  , Disp: , Rfl: ;  simethicone (MYLICON) 125 MG chewable tablet, Chew 125 mg by mouth 3 (three) times daily as needed. , Disp: , Rfl: ;  traMADol-acetaminophen (ULTRACET) 37.5-325 MG per tablet, Take 1 tablet by mouth every 8 (eight) hours as needed. , Disp: , Rfl:  Triptorelin Pamoate (TRELSTAR DEPOT IM), Inject into the muscle every 3 (three) months.  , Disp: , Rfl: ;  Trospium Chloride (SANCTURA XR PO), Take 60 mg by mouth  daily.  , Disp: , Rfl: ;  valsartan-hydrochlorothiazide (DIOVAN-HCT) 320-25 MG per tablet, Take 1 tablet by mouth daily.  , Disp: , Rfl: ;  vitamin C (ASCORBIC ACID) 500 MG tablet, Take 500 mg by mouth daily.  , Disp: , Rfl:  Zoledronic Acid (ZOMETA IV), Inject into the vein every 30 (thirty) days.  , Disp: , Rfl:   Allergies:  Allergies  Allergen Reactions  . Morphine And Related     Past Medical History, Surgical history, Social history, and Family History were reviewed and updated.  Review of Systems: Constitutional:  Negative for fever, chills, night sweats, anorexia, weight loss, pain. Cardiovascular: no chest pain or dyspnea on exertion Respiratory: no cough, shortness of breath, or wheezing Neurological: no TIA or stroke symptoms Dermatological: negative ENT: negative Skin: Negative. Gastrointestinal: no abdominal pain, change in bowel habits, or black or bloody stools Genito-Urinary: no dysuria, trouble voiding, or hematuria Hematological and Lymphatic: negative Breast: negative Musculoskeletal: negative Remaining ROS negative. Physical Exam: Blood pressure 122/60, pulse 60, temperature 97.1 F (36.2 C), temperature source Oral, height 5\' 7"  (1.702 m), weight 170 lb 11.2 oz (77.429 kg). ECOG: 1 General appearance: alert Head: Normocephalic, without obvious abnormality, atraumatic Neck: no adenopathy, no carotid bruit, no JVD, supple, symmetrical, trachea midline and thyroid not enlarged, symmetric, no tenderness/mass/nodules Lymph nodes: Cervical, supraclavicular, and axillary nodes normal. Heart:regular rate and rhythm, S1, S2 normal, no murmur, click, rub or gallop Lung:chest clear, no wheezing, rales, normal symmetric air entry. Dry rales noted at the bases.  Abdomin: soft, non-tender, without masses or organomegaly EXT:no erythema, induration, or nodules. Left thigh: vesicular rash noted. All lesions are crusted. No edema noted.    Lab Results: Lab Results    Component Value Date   WBC 9.6 06/07/2012   HGB 11.8* 06/07/2012   HCT 35.9* 06/07/2012   MCV 94.2 06/07/2012   PLT 199 06/07/2012     Chemistry      Component Value Date/Time   NA 139 05/07/2012 1256   K 4.0 05/07/2012 1256   CL 106 05/07/2012 1256   CO2 27 05/07/2012 1256   BUN 12 05/07/2012 1256   CREATININE 1.11 05/07/2012 1256      Component Value Date/Time   CALCIUM 8.5 05/07/2012 1256   ALKPHOS 38* 05/07/2012 1256   AST 20 05/07/2012 1256   ALT 16 05/07/2012 1256   BILITOT 0.4 05/07/2012 1256      CT ABDOMEN AND PELVIS WITH CONTRAST  Technique: Multidetector CT imaging of the abdomen and pelvis was  performed following the standard protocol during bolus  administration of intravenous contrast.  Contrast: OMNIPAQUE IOHEXOL 300 MG/ML SOLN  Comparison: 10/19/2009 from [facility]. Bone scan of  10/19/2009.  Findings: Mild fibrosis at the lung bases. Minimal  honeycombing.  Similar to 07/2009. Enlargement of the pulmonary arteries. Right  pulmonary artery 3.0 cm. Mild cardiomegaly with prior median  sternotomy. Prior inferior septal left ventricular infarct on  image 17/19. No pericardial or pleural effusion. Small low  mediastinal nodes are unchanged.  Normal liver, spleen, adrenal glands. The gastric body is  underdistended on image 32. Apparent wall thickening is felt to be  secondary.  Abnormal appearance of the pancreas. Relatively diffuse cystic  change, likely representing a combination of side branch and less  so main duct dilatation. More focal cystic focus measures 2.0 cm  in the head/uncinate process junction on image 41. This is new.  There is no evidence of acute pancreatitis or biliary ductal  dilatation. Normal gallbladder.  Lower pole right renal collecting system calculus. Normal left  kidney.  No retroperitoneal or retrocrural adenopathy.  Extensive colonic diverticulosis. Colonic stool burden suggests  constipation.  Normal terminal ileum. Normal  small bowel without abdominal  ascites.  No pelvic adenopathy. Fat containing left inguinal hernia. Normal  urinary bladder. New prostatic atrophy. No significant free fluid.  Fat containing right paramidline ventral abdominal wall hernia on  image 53.  Extensive sclerotic osseous metastasis. Somewhat more well-defined  when back to 07/2009. An anterior L2 lesion is new or increased  since 2010.  Left ninth and tenth rib fractures are nonacute, likely post  traumatic.  IMPRESSION:  1. Progressive osseous metastasis.  2. No evidence of extra osseous metastasis within the abdomen or  pelvis.  3. Abnormal appearance of the pancreas. Little interval change  since 2010. 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 Given the patient age/clinical  status, attention on follow-up appropriate.  4. Right renal calculus.  5. Pulmonary fibrosis at the bases, similar. Pulmonary artery  enlargement suggests pulmonary arterial hypertension.      Results for RESHAUN, BRISENO (MRN [id number]) as of 06/07/2012 15:51  Ref. Range 12/27/2011 14:29 02/21/2012 14:55 04/03/2012 14:50 05/07/2012 12:56  PSA Latest Range: <=4.00 ng/mL 3.82 4.21 (H) 2.97 2.04    Impression and Plan:  This is a pleasant 78 year old gentleman with the following issues:  1. 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   Now he is on Ketoconazole as second line hormonal manipulations since 12/2011. He tolerated it well with out complications. PSA  Is pending from today but it has declined from 4.21 to 2.04. If PSA continue to respond, then will continue the current medication and schedule. If PSA rises again, then will consider a different agent such as Zytiga.  2. Bony disease.  I agree with continued Xgeva on a monthly basis. 3. Hormonal deprivation: He is to continue on Trelstar.  4. Left thigh lesions with questionable  lymphedema. CT scan did not show any lymphatic involvement at this time. I doubt this is medication related, but if everything fails we can consider stopping ketoconazole. I would hate to do that if his PSA continues to respond.  5. Lung fibrosis noted on CT scan: asymptomatic. We would consider Pulmonary referral if he becomes symptomatic.   [facility], MD 6/21/20133:47 PM

## 2012-06-07 NOTE — Telephone Encounter (Signed)
Gv pt appt for july2013 °

## 2012-06-08 LAB — PSA: PSA: 1.64 ng/mL (ref <=4.00)

## 2012-06-10 ENCOUNTER — Telehealth: Payer: Self-pay | Admitting: Nurse Practitioner

## 2012-06-10 NOTE — Telephone Encounter (Signed)
Informed pt per Dr. Clelia Croft- PSA 1.64.  Pt stated "That's great!".

## 2012-06-28 ENCOUNTER — Encounter (HOSPITAL_BASED_OUTPATIENT_CLINIC_OR_DEPARTMENT_OTHER): Payer: Self-pay

## 2012-06-28 ENCOUNTER — Emergency Department (HOSPITAL_BASED_OUTPATIENT_CLINIC_OR_DEPARTMENT_OTHER): Payer: Medicare Other

## 2012-06-28 ENCOUNTER — Emergency Department (HOSPITAL_BASED_OUTPATIENT_CLINIC_OR_DEPARTMENT_OTHER)
Admission: EM | Admit: 2012-06-28 | Discharge: 2012-06-28 | Disposition: A | Discharge status: Home / Self Care | Payer: Medicare Other | Attending: Emergency Medicine | Admitting: Emergency Medicine

## 2012-06-28 DIAGNOSIS — W19XXXA Unspecified fall, initial encounter: Secondary | ICD-10-CM

## 2012-06-28 DIAGNOSIS — I10 Essential (primary) hypertension: Secondary | ICD-10-CM | POA: Insufficient documentation

## 2012-06-28 DIAGNOSIS — Z794 Long term (current) use of insulin: Secondary | ICD-10-CM | POA: Insufficient documentation

## 2012-06-28 DIAGNOSIS — R220 Localized swelling, mass and lump, head: Secondary | ICD-10-CM | POA: Insufficient documentation

## 2012-06-28 DIAGNOSIS — E119 Type 2 diabetes mellitus without complications: Secondary | ICD-10-CM | POA: Insufficient documentation

## 2012-06-28 DIAGNOSIS — M791 Myalgia, unspecified site: Secondary | ICD-10-CM

## 2012-06-28 DIAGNOSIS — S0003XA Contusion of scalp, initial encounter: Secondary | ICD-10-CM | POA: Insufficient documentation

## 2012-06-28 DIAGNOSIS — Z79899 Other long term (current) drug therapy: Secondary | ICD-10-CM | POA: Insufficient documentation

## 2012-06-28 DIAGNOSIS — Z7982 Long term (current) use of aspirin: Secondary | ICD-10-CM | POA: Insufficient documentation

## 2012-06-28 DIAGNOSIS — I252 Old myocardial infarction: Secondary | ICD-10-CM | POA: Insufficient documentation

## 2012-06-28 DIAGNOSIS — Z951 Presence of aortocoronary bypass graft: Secondary | ICD-10-CM | POA: Insufficient documentation

## 2012-06-28 DIAGNOSIS — Z9861 Coronary angioplasty status: Secondary | ICD-10-CM | POA: Insufficient documentation

## 2012-06-28 DIAGNOSIS — E785 Hyperlipidemia, unspecified: Secondary | ICD-10-CM | POA: Insufficient documentation

## 2012-06-28 DIAGNOSIS — E039 Hypothyroidism, unspecified: Secondary | ICD-10-CM | POA: Insufficient documentation

## 2012-06-28 DIAGNOSIS — I2510 Atherosclerotic heart disease of native coronary artery without angina pectoris: Secondary | ICD-10-CM | POA: Insufficient documentation

## 2012-06-28 DIAGNOSIS — W1809XA Striking against other object with subsequent fall, initial encounter: Secondary | ICD-10-CM | POA: Insufficient documentation

## 2012-06-28 DIAGNOSIS — S0083XA Contusion of other part of head, initial encounter: Secondary | ICD-10-CM

## 2012-06-28 DIAGNOSIS — IMO0001 Reserved for inherently not codable concepts without codable children: Secondary | ICD-10-CM | POA: Insufficient documentation

## 2012-06-28 DIAGNOSIS — R5383 Other fatigue: Secondary | ICD-10-CM | POA: Insufficient documentation

## 2012-06-28 DIAGNOSIS — M542 Cervicalgia: Secondary | ICD-10-CM | POA: Insufficient documentation

## 2012-06-28 DIAGNOSIS — S1093XA Contusion of unspecified part of neck, initial encounter: Secondary | ICD-10-CM | POA: Insufficient documentation

## 2012-06-28 DIAGNOSIS — R51 Headache: Secondary | ICD-10-CM | POA: Insufficient documentation

## 2012-06-28 DIAGNOSIS — R221 Localized swelling, mass and lump, neck: Secondary | ICD-10-CM | POA: Insufficient documentation

## 2012-06-28 DIAGNOSIS — R5381 Other malaise: Secondary | ICD-10-CM | POA: Insufficient documentation

## 2012-06-28 HISTORY — DX: Acute myocardial infarction, unspecified: I21.9

## 2012-06-28 LAB — GLUCOSE, CAPILLARY: Glucose-Capillary: 147 mg/dL — ABNORMAL HIGH (ref 70–99)

## 2012-06-28 MED ORDER — HYDROCODONE-ACETAMINOPHEN 5-325 MG PO TABS
1.0000 | ORAL_TABLET | Freq: Once | ORAL | Status: AC
  Administered 2012-06-28: 1 via ORAL
  Filled 2012-06-28: qty 1

## 2012-06-28 MED ORDER — HYDROCODONE-ACETAMINOPHEN 5-325 MG PO TABS: 1.0000 | ORAL_TABLET | ORAL | Status: AC | PRN

## 2012-06-28 NOTE — ED Notes (Signed)
[name] 7/6-stuck a chest in bedroom-c/o pain to left upper back,neck and shoulder-bruising noted to face but denies pain

## 2012-06-28 NOTE — ED Notes (Signed)
Pt ambulatory to restroom without difficulty.

## 2012-06-28 NOTE — ED Provider Notes (Signed)
History     CSN: [id number]  Arrival date & time 06/28/12  1425   First MD Initiated Contact with Patient 06/28/12 1535      Chief Complaint  Patient presents with  . Fall    (Consider location/radiation/quality/duration/timing/severity/associated sxs/prior treatment) Patient is a 78 y.o. male presenting with fall. The history is provided by the patient.  Fall The accident occurred more than 2 days ago. Incident: He got up to the bathroom and felt weak. He found his blood sugar to be low (37 per patient) and he fell. No LOC at the time.  Impact surface: He hit a piece of furniture injuring his anterior neck, and face.  He was ambulatory at the scene. There was no entrapment after the fall. There was no drug use involved in the accident. There was no alcohol use involved in the accident. Pertinent negatives include no fever, no abdominal pain, no nausea, no vomiting and no headaches. Associated symptoms comments: He fell and wife was nearby and reports no LOC, vomiting. He fell one week ago and reports onset of neck and left shoulder pain 2 days ago that prompted him to present for evaluation. No nausea or vomiting this week. .    Past Medical History  Diagnosis Date  . Prostate cancer   . Diabetes mellitus   . Hypertension   . CAD (coronary artery disease)   . Spinal stenosis   . BPH (benign prostatic hyperplasia)   . Hyperlipemia   . Hypothyroidism   . Myocardial infarction     Past Surgical History  Procedure Date  . Bypass graft   . Knee surgery   . Gastrojejunostomy   . Vagotomy   . Coronary artery bypass graft   . Angioplasty   . Coronary angioplasty with stent placement     No family history on file.  History  Substance Use Topics  . Smoking status: Former Games developer  . Smokeless tobacco: Not on file  . Alcohol Use: 1.2 oz/week    2 Glasses of wine per week      Review of Systems  Constitutional: Negative for fever.  HENT: Positive for facial swelling and  neck pain. Negative for trouble swallowing and dental problem.   Respiratory: Negative for shortness of breath.   Cardiovascular: Negative for chest pain.  Gastrointestinal: Negative for nausea, vomiting and abdominal pain.  Musculoskeletal:       See HPI.   Neurological: Negative for headaches.    Allergies  Morphine and related  Home Medications   Current Outpatient Rx  Name Route Sig Dispense Refill  . AMLODIPINE BESYLATE 5 MG PO TABS Oral Take 5 mg by mouth daily.      . ASPIRIN 81 MG PO TABS Oral Take 81 mg by mouth daily.      . ATENOLOL 25 MG PO TABS Oral Take 25 mg by mouth daily.      Marland Kitchen CALCIUM + D PO Oral Take 1 tablet by mouth 2 (two) times daily. Cal 600 mg and Vit D 200 IU     . VITAMIN D-3 PO Oral Take 2,000 Units by mouth daily.      Marland Kitchen CLOPIDOGREL BISULFATE 75 MG PO TABS Oral Take 75 mg by mouth daily.      Marland Kitchen FLUTICASONE PROPIONATE 50 MCG/ACT NA SUSP Nasal Place 4 sprays into the nose daily.      Marland Kitchen FOLIC ACID-VIT B6-VIT B12 0.8-10-0.115 MG PO TABS Oral Take 1 tablet by mouth daily.      Marland Kitchen  INSULIN GLARGINE 100 UNIT/ML [place] SOLN Subcutaneous Inject 17 Units into the skin 2 (two) times daily.     . INSULIN LISPRO (HUMAN) 100 UNIT/ML [place] SOLN Subcutaneous Inject 9 Units into the skin 2 (two) times daily with a meal. Before breakfast and dinner.    . IPRATROPIUM BROMIDE 0.03 % NA SOLN Nasal Place 4 sprays into the nose every 12 (twelve) hours.      Marland Kitchen KETOCONAZOLE 200 MG PO TABS Oral Take 200 mg by mouth 2 (two) times daily.    Marland Kitchen LEVOCETIRIZINE DIHYDROCHLORIDE 5 MG PO TABS Oral Take 5 mg by mouth every evening.      Marland Kitchen LEVOTHYROXINE SODIUM 175 MCG PO TABS Oral Take 200 mcg by mouth daily. Alternate 200mg  and 175mg  daily.    Marland Kitchen PANTOPRAZOLE SODIUM 40 MG PO TBEC Oral Take 40 mg by mouth daily.      Marland Kitchen PREDNISONE 5 MG PO TABS Oral Take 5 mg by mouth daily.    Marland Kitchen ROSUVASTATIN CALCIUM 10 MG PO TABS Oral Take 10 mg by mouth daily.      Marland Kitchen SIMETHICONE 125 MG PO CHEW Oral Chew 125 mg by  mouth 3 (three) times daily as needed.     Marland Kitchen TRAMADOL-ACETAMINOPHEN 37.5-325 MG PO TABS Oral Take 1 tablet by mouth every 8 (eight) hours as needed.     Amedeo Gory DEPOT IM Intramuscular Inject into the muscle every 3 (three) months.      Marland Kitchen SANCTURA XR PO Oral Take 60 mg by mouth daily.      Marland Kitchen VALSARTAN-HYDROCHLOROTHIAZIDE 320-25 MG PO TABS Oral Take 1 tablet by mouth daily.      Marland Kitchen VITAMIN C 500 MG PO TABS Oral Take 500 mg by mouth daily.      Marland Kitchen ZOMETA IV Intravenous Inject into the vein every 30 (thirty) days.        BP 118/50  Pulse 60  Temp 98.1 F (36.7 C) (Oral)  Resp 18  Ht 5\' 9"  (1.753 m)  Wt 165 lb (74.844 kg)  BMI 24.37 kg/m2  SpO2 99%  Physical Exam  Constitutional: He is oriented to person, place, and time. He appears well-developed and well-nourished. No distress.  HENT:  Head: Normocephalic and atraumatic.  Right Ear: External ear normal.  Left Ear: External ear normal.  Nose: Nose normal.  Mouth/Throat: Oropharynx is clear and moist.       Ecchymosis upper lip extending bilaterally to sides of mouth. No swelling. No nasal swelling or discoloration. Nares patent bilaterally. Stable dentition without injury. No facial bony tenderness. Ears clear bilaterally without hemotympanum.   Eyes: Conjunctivae are normal. Pupils are equal, round, and reactive to light.  Neck: Normal range of motion.  Cardiovascular: Normal rate and regular rhythm.   No murmur heard. Pulmonary/Chest: Effort normal and breath sounds normal. He has no wheezes. He has no rales.  Abdominal: Soft. There is no tenderness.  Musculoskeletal: He exhibits no edema.       Right upper extremity bandaged - reported healing abrasions. FROM without pain of all extremities. Right UE is nontender. Neck with mild midline tenderness and left paracervical tenderness. No swelling FROM with mild subjective discomfort. Left shoulder unremarkable in appearance. FROM. Tender trapezius without swelling or mass.     Neurological: He is alert and oriented to person, place, and time. Coordination normal.  Skin: Skin is warm and dry.  Psychiatric: He has a normal mood and affect.    ED Course  Procedures (including critical care  time)  Labs Reviewed - No data to display Ct Head Wo Contrast  06/28/2012  *RADIOLOGY REPORT*  Clinical Data:  Status post fall 6 days ago with facial pain and bruising.  Neck pain.  CT HEAD WITHOUT CONTRAST CT MAXILLOFACIAL WITHOUT CONTRAST CT CERVICAL SPINE WITHOUT CONTRAST  Technique:  Multidetector CT imaging of the head, cervical spine, and maxillofacial structures were performed using the standard protocol without intravenous contrast. Multiplanar CT image reconstructions of the cervical spine and maxillofacial structures were also generated.  Comparison:  Maxillofacial CT 04/28/2011.  CT HEAD  Findings: There is age appropriate atrophy with mild small vessel ischemic change in the periventricular white matter.  No acute intracranial hemorrhage, mass lesion or brain edema is demonstrated.  There is no extra-axial fluid collection or hydrocephalus.  The visualized paranasal sinuses are clear.  The calvarium is intact.  Intracranial vascular calcifications are noted.  IMPRESSION: No acute or reversible intracranial findings.  No evidence of fracture.  CT MAXILLOFACIAL  Findings:  There is no evidence of acute facial fracture.  The paranasal sinuses are clear without air fluid levels.  There is no evidence of orbital hematoma.  The mandible and temporomandibular joints are intact.  IMPRESSION: No evidence of acute facial fracture or orbital hematoma.  CT CERVICAL SPINE  Findings:   Patient is status post C5-C6 fusion.  Fusion appears solid.  There is irregular sclerosis throughout the C5, C6 and C7 vertebral bodies.  No lytic lesion or spinal hardware is present. The spinal alignment is near anatomic.  There is a degenerative anterolisthesis of C4 on C5.  This is associated with asymmetric  left-sided facet hypertrophy.  There is no evidence of acute cervical spine fracture or traumatic subluxation. There is mild inferior endplate deformity at T1.  No acute soft tissue findings are seen. Some sclerosis of the right second and third ribs is noted.  IMPRESSION:  1.  No evidence of acute cervical spine fracture or traumatic subluxation. Age indeterminate inferior endplate compression deformity at T1. 2.  Lower cervical fusion with irregular sclerosis of the C5-C7 marked vertebral bodies, possibly related to prior fusion. There is also some sclerosis of the right second and third ribs; blastic metastatic disease cannot be excluded.  Original Report Authenticated By: Gerrianne Scale, M.D.   Ct Cervical Spine Wo Contrast  06/28/2012  *RADIOLOGY REPORT*  Clinical Data:  Status post fall 6 days ago with facial pain and bruising.  Neck pain.  CT HEAD WITHOUT CONTRAST CT MAXILLOFACIAL WITHOUT CONTRAST CT CERVICAL SPINE WITHOUT CONTRAST  Technique:  Multidetector CT imaging of the head, cervical spine, and maxillofacial structures were performed using the standard protocol without intravenous contrast. Multiplanar CT image reconstructions of the cervical spine and maxillofacial structures were also generated.  Comparison:  Maxillofacial CT 04/28/2011.  CT HEAD  Findings: There is age appropriate atrophy with mild small vessel ischemic change in the periventricular white matter.  No acute intracranial hemorrhage, mass lesion or brain edema is demonstrated.  There is no extra-axial fluid collection or hydrocephalus.  The visualized paranasal sinuses are clear.  The calvarium is intact.  Intracranial vascular calcifications are noted.  IMPRESSION: No acute or reversible intracranial findings.  No evidence of fracture.  CT MAXILLOFACIAL  Findings:  There is no evidence of acute facial fracture.  The paranasal sinuses are clear without air fluid levels.  There is no evidence of orbital hematoma.  The mandible and  temporomandibular joints are intact.  IMPRESSION: No evidence of acute facial  fracture or orbital hematoma.  CT CERVICAL SPINE  Findings:   Patient is status post C5-C6 fusion.  Fusion appears solid.  There is irregular sclerosis throughout the C5, C6 and C7 vertebral bodies.  No lytic lesion or spinal hardware is present. The spinal alignment is near anatomic.  There is a degenerative anterolisthesis of C4 on C5.  This is associated with asymmetric left-sided facet hypertrophy.  There is no evidence of acute cervical spine fracture or traumatic subluxation. There is mild inferior endplate deformity at T1.  No acute soft tissue findings are seen. Some sclerosis of the right second and third ribs is noted.  IMPRESSION:  1.  No evidence of acute cervical spine fracture or traumatic subluxation. Age indeterminate inferior endplate compression deformity at T1. 2.  Lower cervical fusion with irregular sclerosis of the C5-C7 marked vertebral bodies, possibly related to prior fusion. There is also some sclerosis of the right second and third ribs; blastic metastatic disease cannot be excluded.  Original Report Authenticated By: Gerrianne Scale, M.D.   Ct Maxillofacial Wo Cm  06/28/2012  *RADIOLOGY REPORT*  Clinical Data:  Status post fall 6 days ago with facial pain and bruising.  Neck pain.  CT HEAD WITHOUT CONTRAST CT MAXILLOFACIAL WITHOUT CONTRAST CT CERVICAL SPINE WITHOUT CONTRAST  Technique:  Multidetector CT imaging of the head, cervical spine, and maxillofacial structures were performed using the standard protocol without intravenous contrast. Multiplanar CT image reconstructions of the cervical spine and maxillofacial structures were also generated.  Comparison:  Maxillofacial CT 04/28/2011.  CT HEAD  Findings: There is age appropriate atrophy with mild small vessel ischemic change in the periventricular white matter.  No acute intracranial hemorrhage, mass lesion or brain edema is demonstrated.  There is no  extra-axial fluid collection or hydrocephalus.  The visualized paranasal sinuses are clear.  The calvarium is intact.  Intracranial vascular calcifications are noted.  IMPRESSION: No acute or reversible intracranial findings.  No evidence of fracture.  CT MAXILLOFACIAL  Findings:  There is no evidence of acute facial fracture.  The paranasal sinuses are clear without air fluid levels.  There is no evidence of orbital hematoma.  The mandible and temporomandibular joints are intact.  IMPRESSION: No evidence of acute facial fracture or orbital hematoma.  CT CERVICAL SPINE  Findings:   Patient is status post C5-C6 fusion.  Fusion appears solid.  There is irregular sclerosis throughout the C5, C6 and C7 vertebral bodies.  No lytic lesion or spinal hardware is present. The spinal alignment is near anatomic.  There is a degenerative anterolisthesis of C4 on C5.  This is associated with asymmetric left-sided facet hypertrophy.  There is no evidence of acute cervical spine fracture or traumatic subluxation. There is mild inferior endplate deformity at T1.  No acute soft tissue findings are seen. Some sclerosis of the right second and third ribs is noted.  IMPRESSION:  1.  No evidence of acute cervical spine fracture or traumatic subluxation. Age indeterminate inferior endplate compression deformity at T1. 2.  Lower cervical fusion with irregular sclerosis of the C5-C7 marked vertebral bodies, possibly related to prior fusion. There is also some sclerosis of the right second and third ribs; blastic metastatic disease cannot be excluded.  Original Report Authenticated By: Gerrianne Scale, M.D.     No diagnosis found.  1. Facial contusions 2. Fall 3. Musculoskeletal neck pain  MDM  One week out from injury - no neurologic complaints prompting evaluation. CT scans negative. He can be  discharged home with pain management.         Rodena Medin, PA-C 06/28/12 1827

## 2012-06-29 NOTE — ED Provider Notes (Signed)
Medical screening examination/treatment/procedure(s) were conducted as a shared visit with non-physician practitioner(s) and myself.  I personally evaluated the patient during the encounter  Fall 7 days ago during episode of hypoglycemia. Glu stable since that time. On plavix. Mult complaints. Left trap ttp., ecchymosis (perioral) noted. Strength 5/5 b/l UE. Imaging unremarkable. Wound care provided for skin tear.  Forbes Cellar, MD 06/29/12 [phone]

## 2012-07-02 ENCOUNTER — Other Ambulatory Visit: Payer: Self-pay | Admitting: Oncology

## 2012-07-11 ENCOUNTER — Encounter: Payer: Self-pay | Admitting: Oncology

## 2012-07-11 ENCOUNTER — Other Ambulatory Visit (HOSPITAL_BASED_OUTPATIENT_CLINIC_OR_DEPARTMENT_OTHER): Payer: Medicare Other

## 2012-07-11 ENCOUNTER — Ambulatory Visit (HOSPITAL_BASED_OUTPATIENT_CLINIC_OR_DEPARTMENT_OTHER): Payer: Medicare Other | Admitting: Oncology

## 2012-07-11 ENCOUNTER — Telehealth: Payer: Self-pay | Admitting: *Deleted

## 2012-07-11 VITALS — BP 131/57 | HR 54 | Temp 98.7°F | Ht 69.0 in | Wt 169.8 lb

## 2012-07-11 DIAGNOSIS — C7952 Secondary malignant neoplasm of bone marrow: Secondary | ICD-10-CM

## 2012-07-11 DIAGNOSIS — J8410 Pulmonary fibrosis, unspecified: Secondary | ICD-10-CM

## 2012-07-11 DIAGNOSIS — C61 Malignant neoplasm of prostate: Secondary | ICD-10-CM

## 2012-07-11 DIAGNOSIS — D649 Anemia, unspecified: Secondary | ICD-10-CM

## 2012-07-11 LAB — CBC WITH DIFFERENTIAL/PLATELET
BASO%: 0.9 % (ref 0.0–2.0)
EOS%: 1.7 % (ref 0.0–7.0)
LYMPH%: 8.1 % — ABNORMAL LOW (ref 14.0–49.0)
MCH: 30.7 pg (ref 27.2–33.4)
MCHC: 32.8 g/dL (ref 32.0–36.0)
MONO#: 0.4 10*3/uL (ref 0.1–0.9)
Platelets: 329 10*3/uL (ref 140–400)
RBC: 3.17 10*6/uL — ABNORMAL LOW (ref 4.20–5.82)
WBC: 8.5 10*3/uL (ref 4.0–10.3)
lymph#: 0.7 10*3/uL — ABNORMAL LOW (ref 0.9–3.3)

## 2012-07-11 LAB — COMPREHENSIVE METABOLIC PANEL
ALT: 14 U/L (ref 0–53)
AST: 18 U/L (ref 0–37)
CO2: 24 mEq/L (ref 19–32)
Creatinine, Ser: 1.17 mg/dL (ref 0.50–1.35)
Sodium: 139 mEq/L (ref 135–145)
Total Bilirubin: 0.4 mg/dL (ref 0.3–1.2)
Total Protein: 5.5 g/dL — ABNORMAL LOW (ref 6.0–8.3)

## 2012-07-11 LAB — PSA: PSA: 1.64 ng/mL (ref <=4.00)

## 2012-07-11 NOTE — Telephone Encounter (Signed)
Gave patient appointment for 08-08-2012 starting at 11:00am per orders from 07-11-2012

## 2012-07-11 NOTE — Progress Notes (Signed)
Hematology and Oncology Follow Up Visit  Jackson Mccormick [id number] 1928/03/18 78 y.o. 07/11/2012 4:22 PM  CC: Jackson Mccormick. Jackson Mccormick, M.D.  Jackson Mccormick, M.D.  Jackson Mccormick. Jackson Mccormick, M.D.    Principle Diagnosis: This is an 78 year old Asian gentleman with prostate cancer initially diagnosed Gleason score 4 + 5 = 9 in October 2010 with a PSA of 223.  He has advanced bony metastasis.  Prior Therapy:  1. The patient was treated with hormonal deprivation, initially with Deborra Medina therapy and subsequently to [employer]  He had an excellent response, PSA dropped from 223 down to 0.08. 2. His PSA started to rise as high as 0.54 in May 2012. 3. Casodex was started around August 2012 through January 2013. Medication was stopped due to a rising PSA.  Current therapy:  1. Trelstar every 3 months at [facility] 2. Rivka Barbara monthly at [facility]  3. Ketoconazole 200 mg BID with prednisone 5 mg daily started in in 1/ 2013   Interim History: Jackson Mccormick presents today for a followup visit.  Since we last saw him, he had a hypoglycemic episode and fell. He hit his chin and throat and was seen in the ER. No fractures noted. No sutures needed. He had significant bruising to his face and neck that is improving. He is not reporting any major problems related to his prostate cancer or ketoconazole. He has not had any pathological fractures. His appetite and performance status are excellent at this time. He is asymptomatic. He is doing well with Ketoconazole and prednisone without complications. No chest pain, shortness of breath, abdominal pain, nausea, or vomiting. No bleeding.  Medications: I have reviewed the patient's current medications. Current outpatient prescriptions:amLODipine (NORVASC) 5 MG tablet, Take 5 mg by mouth daily.  , Disp: , Rfl: ;  aspirin 81 MG tablet, Take 81 mg by mouth daily.  , Disp: , Rfl: ;  atenolol (TENORMIN) 25 MG tablet, Take 25 mg by mouth daily.  , Disp: , Rfl: ;  Calcium  Carbonate-Vitamin D (CALCIUM + D PO), Take 1 tablet by mouth 2 (two) times daily. Cal 600 mg and Vit D 200 IU , Disp: , Rfl:  Cholecalciferol (VITAMIN D-3 PO), Take 2,000 Units by mouth daily.  , Disp: , Rfl: ;  clopidogrel (PLAVIX) 75 MG tablet, Take 75 mg by mouth daily.  , Disp: , Rfl: ;  denosumab (XGEVA) 120 MG/1.7ML SOLN, Inject 120 mg into the skin every 30 (thirty) days., Disp: , Rfl: ;  fluticasone (FLONASE) 50 MCG/ACT nasal spray, Place 4 sprays into the nose daily.  , Disp: , Rfl:  Folic Acid-Vit B6-Vit B12 (FOLGARD) 0.8-10-0.115 MG TABS, Take 1 tablet by mouth daily.  , Disp: , Rfl: ;  insulin glargine (LANTUS) 100 UNIT/ML injection, Inject 17 Units into the skin 2 (two) times daily. , Disp: , Rfl: ;  insulin lispro (HUMALOG) 100 UNIT/ML injection, Inject 6-15 Units into the skin 2 (two) times daily with a meal. Before breakfast and dinner., Disp: , Rfl:  ipratropium (ATROVENT) 0.03 % nasal spray, Place 4 sprays into the nose every 12 (twelve) hours.  , Disp: , Rfl: ;  ketoconazole (NIZORAL) 200 MG tablet, TAKE 1 TABLET TWICE DAILY, Disp: 180 tablet, Rfl: PRN;  levocetirizine (XYZAL) 5 MG tablet, Take 5 mg by mouth every evening.  , Disp: , Rfl: ;  levothyroxine (SYNTHROID, LEVOTHROID) 175 MCG tablet, Take 200 mcg by mouth daily. Alternate 200mg  and 175mg  daily., Disp: , Rfl:  pantoprazole (PROTONIX) 40  MG tablet, Take 40 mg by mouth daily.  , Disp: , Rfl: ;  predniSONE (DELTASONE) 5 MG tablet, TAKE 1 TABLET DAILY, Disp: 90 tablet, Rfl: PRN;  rosuvastatin (CRESTOR) 10 MG tablet, Take 10 mg by mouth daily.  , Disp: , Rfl: ;  simethicone (MYLICON) 125 MG chewable tablet, Chew 125 mg by mouth 3 (three) times daily as needed. , Disp: , Rfl:  traMADol-acetaminophen (ULTRACET) 37.5-325 MG per tablet, Take 1 tablet by mouth 3 (three) times daily. , Disp: , Rfl: ;  Triptorelin Pamoate (TRELSTAR DEPOT IM), Inject into the muscle every 3 (three) months.  , Disp: , Rfl: ;  Trospium Chloride (SANCTURA XR PO),  Take 60 mg by mouth daily.  , Disp: , Rfl: ;  valsartan-hydrochlorothiazide (DIOVAN-HCT) 320-25 MG per tablet, Take 1 tablet by mouth daily.  , Disp: , Rfl:  vitamin C (ASCORBIC ACID) 500 MG tablet, Take 500 mg by mouth daily.  , Disp: , Rfl:   Allergies:  Allergies  Allergen Reactions  . Morphine And Related Other (See Comments)    Red streaks over my body     Past Medical History, Surgical history, Social history, and Family History were reviewed and updated.  Review of Systems: Constitutional:  Negative for fever, chills, night sweats, anorexia, weight loss, pain. Cardiovascular: no chest pain or dyspnea on exertion Respiratory: no cough, shortness of breath, or wheezing Neurological: no TIA or stroke symptoms Dermatological: negative ENT: negative Skin: Negative. Gastrointestinal: no abdominal pain, change in bowel habits, or black or bloody stools Genito-Urinary: no dysuria, trouble voiding, or hematuria Hematological and Lymphatic: negative Breast: negative Musculoskeletal: negative Remaining ROS negative.  Physical Exam: Blood pressure 131/57, pulse 54, temperature 98.7 F (37.1 C), temperature source Oral, height 5\' 9"  (1.753 m), weight 169 lb 12.8 oz (77.021 kg). ECOG: 1 General appearance: alert Head: Normocephalic, without obvious abnormality, atraumatic Neck: no adenopathy, no carotid bruit, no JVD, supple, symmetrical, trachea midline and thyroid not enlarged, symmetric, no tenderness/mass/nodules Lymph nodes: Cervical, supraclavicular, and axillary nodes normal. Heart:regular rate and rhythm, S1, S2 normal, no murmur, click, rub or gallop Lung:chest clear, no wheezing, rales, normal symmetric air entry. Dry rales noted at the bases.  Abdomen: soft, non-tender, without masses or organomegaly EXT:no erythema, induration, or nodules. Left thigh: vesicular rash noted. All lesions are crusted. No edema noted.    Lab Results: Lab Results  Component Value Date    WBC 8.5 07/11/2012   HGB 9.7* 07/11/2012   HCT 29.7* 07/11/2012   MCV 93.7 07/11/2012   PLT 329 07/11/2012     Chemistry      Component Value Date/Time   NA 136 06/07/2012 1508   K 5.2 06/07/2012 1508   CL 101 06/07/2012 1508   CO2 28 06/07/2012 1508   BUN 15 06/07/2012 1508   CREATININE 1.34 06/07/2012 1508      Component Value Date/Time   CALCIUM 9.3 06/07/2012 1508   ALKPHOS 48 06/07/2012 1508   AST 25 06/07/2012 1508   ALT 18 06/07/2012 1508   BILITOT 0.3 06/07/2012 1508     Results for BIRAN, MAYBERRY (MRN [id number]) as of 07/11/2012 15:17  Ref. Range 12/27/2011 14:29 02/21/2012 14:55 04/03/2012 14:50 05/07/2012 12:56 06/07/2012 15:08  PSA Latest Range: <=4.00 ng/mL 3.82 4.21 (H) 2.97 2.04 1.64    Impression and Plan:  This is a pleasant 78 year old gentleman with the following issues:  1. 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   Now he is on Ketoconazole as second  line hormonal manipulations since 12/2011. He tolerated it well with out complications. PSA  Is pending from today but it has declined from 4.21 to 1.64. If PSA continue to respond, then will continue the current medication and schedule. If PSA rises again, then will consider a different agent such as Zytiga.  2. Bony disease. Continue Xgeva on a monthly basis. 3. Hormonal deprivation: He is to continue on Trelstar.  4. Anemia. Likely due to recent fall and hematomas. He does not currently have any active bleeding. No transfusion is indicated. 5. Lung fibrosis noted on CT scan: asymptomatic. We would consider Pulmonary referral if he becomes symptomatic.  6. Follow-up. In 4-5 weeks.  Clenton Pare 7/25/20134:22 PM

## 2012-07-12 ENCOUNTER — Telehealth: Payer: Self-pay | Admitting: *Deleted

## 2012-07-12 NOTE — Telephone Encounter (Signed)
Called patient with PSA results drawn 07/11/2012.  Patient verbalized understanding. 

## 2012-08-01 ENCOUNTER — Telehealth: Payer: Self-pay | Admitting: Oncology

## 2012-08-01 NOTE — Telephone Encounter (Signed)
Moved 8/22 appt to 9/4. Mailed schedule. Not able to reach pt by phone line rings busy.

## 2012-08-08 ENCOUNTER — Other Ambulatory Visit: Payer: Medicare Other | Admitting: Lab

## 2012-08-08 ENCOUNTER — Ambulatory Visit: Payer: Medicare Other | Admitting: Oncology

## 2012-08-21 ENCOUNTER — Telehealth: Payer: Self-pay | Admitting: Oncology

## 2012-08-21 ENCOUNTER — Other Ambulatory Visit (HOSPITAL_BASED_OUTPATIENT_CLINIC_OR_DEPARTMENT_OTHER): Payer: Medicare Other | Admitting: Lab

## 2012-08-21 ENCOUNTER — Ambulatory Visit (HOSPITAL_BASED_OUTPATIENT_CLINIC_OR_DEPARTMENT_OTHER): Payer: Medicare Other | Admitting: Oncology

## 2012-08-21 VITALS — BP 130/63 | HR 55 | Temp 97.8°F | Resp 20 | Ht 69.0 in | Wt 166.8 lb

## 2012-08-21 DIAGNOSIS — C61 Malignant neoplasm of prostate: Secondary | ICD-10-CM

## 2012-08-21 DIAGNOSIS — C7951 Secondary malignant neoplasm of bone: Secondary | ICD-10-CM

## 2012-08-21 DIAGNOSIS — D649 Anemia, unspecified: Secondary | ICD-10-CM

## 2012-08-21 DIAGNOSIS — J984 Other disorders of lung: Secondary | ICD-10-CM

## 2012-08-21 LAB — CBC WITH DIFFERENTIAL/PLATELET
BASO%: 0.4 % (ref 0.0–2.0)
Basophils Absolute: 0 10*3/uL (ref 0.0–0.1)
EOS%: 1 % (ref 0.0–7.0)
HCT: 34.2 % — ABNORMAL LOW (ref 38.4–49.9)
HGB: 11.3 g/dL — ABNORMAL LOW (ref 13.0–17.1)
LYMPH%: 10.1 % — ABNORMAL LOW (ref 14.0–49.0)
MCH: 30 pg (ref 27.2–33.4)
MCHC: 33 g/dL (ref 32.0–36.0)
MCV: 90.7 fL (ref 79.3–98.0)
MONO%: 3.3 % (ref 0.0–14.0)
NEUT%: 85.2 % — ABNORMAL HIGH (ref 39.0–75.0)
Platelets: 226 10*3/uL (ref 140–400)
lymph#: 0.9 10*3/uL (ref 0.9–3.3)

## 2012-08-21 LAB — COMPREHENSIVE METABOLIC PANEL (CC13)
Alkaline Phosphatase: 49 U/L (ref 40–150)
BUN: 13 mg/dL (ref 7.0–26.0)
CO2: 24 mEq/L (ref 22–29)
Creatinine: 1.2 mg/dL (ref 0.7–1.3)
Glucose: 120 mg/dl — ABNORMAL HIGH (ref 70–99)
Sodium: 138 mEq/L (ref 136–145)
Total Bilirubin: 0.4 mg/dL (ref 0.20–1.20)
Total Protein: 6 g/dL — ABNORMAL LOW (ref 6.4–8.3)

## 2012-08-21 NOTE — Telephone Encounter (Signed)
Gave pt appt calendar for October 2013 lab and MD °

## 2012-08-21 NOTE — Telephone Encounter (Signed)
Gave pt appt calendar for October 2013 lab and MD

## 2012-08-21 NOTE — Progress Notes (Signed)
Hematology and Oncology Follow Up Visit  Jackson Mccormick [id number] [birth year] 78 y.o. 08/21/2012 3:49 PM  CC: Excell Seltzer. Jackson Mccormick, M.D.  Jackson Mccormick, M.D.  Jackson Mccormick. [name], M.D.    Principle Diagnosis: This is an 78 year old Asian gentleman with prostate cancer initially diagnosed Gleason score 4 + 5 = 9 in October 2010 with a PSA of 223.  He has advanced bony metastasis.  Prior Therapy:  1. The patient was treated with hormonal deprivation, initially with Deborra Medina therapy and subsequently to [employer]  He had an excellent response, PSA dropped from 223 down to 0.08. 2. His PSA started to rise as high as 0.54 in May 2012. 3. Casodex was started around August 2012 through January 2013. Medication was stopped due to a rising PSA.  Current therapy:  1. Trelstar every 3 months at [facility] 2. Rivka Barbara monthly at [facility]  3. Ketoconazole 200 mg BID with prednisone 5 mg daily started in in 1/ 2013   Interim History: Mr. Jackson Mccormick presents today for a followup visit. He is doing well since his last visit. He is not reporting any major problems related to his prostate cancer or ketoconazole. He has not had any pathological fractures. His appetite and performance status are excellent at this time. He is asymptomatic. He is doing well with Ketoconazole and prednisone without complications. No chest pain, shortness of breath, abdominal pain, nausea, or vomiting. No bleeding.  Medications: I have reviewed the patient's current medications. Current outpatient prescriptions:amLODipine (NORVASC) 5 MG tablet, Take 5 mg by mouth daily.  , Disp: , Rfl: ;  aspirin 81 MG tablet, Take 81 mg by mouth daily.  , Disp: , Rfl: ;  atenolol (TENORMIN) 25 MG tablet, Take 25 mg by mouth daily.  , Disp: , Rfl: ;  Calcium Carbonate-Vitamin D (CALCIUM + D PO), Take 1 tablet by mouth 2 (two) times daily. Cal 600 mg and Vit D 200 IU , Disp: , Rfl:  Cholecalciferol (VITAMIN D-3 PO), Take 2,000 Units by mouth daily.   , Disp: , Rfl: ;  clopidogrel (PLAVIX) 75 MG tablet, Take 75 mg by mouth daily.  , Disp: , Rfl: ;  denosumab (XGEVA) 120 MG/1.7ML SOLN, Inject 120 mg into the skin every 30 (thirty) days., Disp: , Rfl: ;  fluticasone (FLONASE) 50 MCG/ACT nasal spray, Place 4 sprays into the nose daily.  , Disp: , Rfl:  Folic Acid-Vit B6-Vit B12 (FOLGARD) 0.8-10-0.115 MG TABS, Take 1 tablet by mouth daily.  , Disp: , Rfl: ;  insulin glargine (LANTUS) 100 UNIT/ML injection, Inject 17 Units into the skin 2 (two) times daily. , Disp: , Rfl: ;  insulin lispro (HUMALOG) 100 UNIT/ML injection, Inject 6-15 Units into the skin 2 (two) times daily with a meal. Before breakfast and dinner., Disp: , Rfl:  ipratropium (ATROVENT) 0.03 % nasal spray, Place 4 sprays into the nose every 12 (twelve) hours.  , Disp: , Rfl: ;  ketoconazole (NIZORAL) 200 MG tablet, TAKE 1 TABLET TWICE DAILY, Disp: 180 tablet, Rfl: PRN;  levocetirizine (XYZAL) 5 MG tablet, Take 5 mg by mouth every evening.  , Disp: , Rfl: ;  levothyroxine (SYNTHROID, LEVOTHROID) 200 MCG tablet, Take 200 mcg by mouth daily., Disp: , Rfl:  pantoprazole (PROTONIX) 40 MG tablet, Take 40 mg by mouth daily.  , Disp: , Rfl: ;  predniSONE (DELTASONE) 5 MG tablet, TAKE 1 TABLET DAILY, Disp: 90 tablet, Rfl: PRN;  rosuvastatin (CRESTOR) 10 MG tablet, Take 10 mg by mouth  daily.  , Disp: , Rfl: ;  simethicone (MYLICON) 125 MG chewable tablet, Chew 125 mg by mouth every 6 (six) hours as needed., Disp: , Rfl:  traMADol-acetaminophen (ULTRACET) 37.5-325 MG per tablet, Take 1 tablet by mouth 3 (three) times daily. , Disp: , Rfl: ;  Triptorelin Pamoate (TRELSTAR DEPOT IM), Inject into the muscle every 3 (three) months.  , Disp: , Rfl: ;  Trospium Chloride (SANCTURA XR PO), Take 60 mg by mouth daily.  , Disp: , Rfl: ;  valsartan-hydrochlorothiazide (DIOVAN-HCT) 320-25 MG per tablet, Take 1 tablet by mouth daily.  , Disp: , Rfl:  vitamin C (ASCORBIC ACID) 500 MG tablet, Take 500 mg by mouth daily.  ,  Disp: , Rfl:   Allergies:  Allergies  Allergen Reactions  . Morphine And Related Other (See Comments)    Red streaks over my body     Past Medical History, Surgical history, Social history, and Family History were reviewed and updated.  Review of Systems: Constitutional:  Negative for fever, chills, night sweats, anorexia, weight loss, pain. Cardiovascular: no chest pain or dyspnea on exertion Respiratory: no cough, shortness of breath, or wheezing Neurological: no TIA or stroke symptoms Dermatological: negative ENT: negative Skin: Negative. Gastrointestinal: no abdominal pain, change in bowel habits, or black or bloody stools Genito-Urinary: no dysuria, trouble voiding, or hematuria Hematological and Lymphatic: negative Breast: negative Musculoskeletal: negative Remaining ROS negative.  Physical Exam: Blood pressure 130/63, pulse 55, temperature 97.8 F (36.6 C), resp. rate 20, height 5\' 9"  (1.753 m), weight 166 lb 12.8 oz (75.66 kg). ECOG: 1 General appearance: alert Head: Normocephalic, without obvious abnormality, atraumatic Neck: no adenopathy, no carotid bruit, no JVD, supple, symmetrical, trachea midline and thyroid not enlarged, symmetric, no tenderness/mass/nodules Lymph nodes: Cervical, supraclavicular, and axillary nodes normal. Heart:regular rate and rhythm, S1, S2 normal, no murmur, click, rub or gallop Lung:chest clear, no wheezing, rales, normal symmetric air entry. Dry rales noted at the bases.  Abdomen: soft, non-tender, without masses or organomegaly EXT:no erythema, induration, or nodules. Left thigh: vesicular rash noted. All lesions are crusted. No edema noted.    Lab Results: Lab Results  Component Value Date   WBC 9.0 08/21/2012   HGB 11.3* 08/21/2012   HCT 34.2* 08/21/2012   MCV 90.7 08/21/2012   PLT 226 08/21/2012     Chemistry      Component Value Date/Time   NA 139 07/11/2012 1424   K 5.0 07/11/2012 1424   CL 107 07/11/2012 1424   CO2 24 07/11/2012  1424   BUN 17 07/11/2012 1424   CREATININE 1.17 07/11/2012 1424      Component Value Date/Time   CALCIUM 8.7 07/11/2012 1424   ALKPHOS 43 07/11/2012 1424   AST 18 07/11/2012 1424   ALT 14 07/11/2012 1424   BILITOT 0.4 07/11/2012 1424      Results for RENEL, ENDE (MRN [id number]) as of 08/21/2012 15:34  Ref. Range 06/07/2012 15:08 07/11/2012 14:24  PSA Latest Range: <=4.00 ng/mL 1.64 1.64    Impression and Plan:  This is a pleasant 78 year old gentleman with the following issues:  1. 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   Now he is on Ketoconazole as second line hormonal manipulations since 12/2011. He tolerated it well with out complications. PSA  Is pending from today but it has declined from 4.21 to 1.64. If PSA continue to respond, then will continue the current medication and schedule. If PSA rises again, then will consider a different agent such  as Zytiga.  2. Bony disease. Continue Xgeva on a monthly basis. 3. Hormonal deprivation: He is to continue on Trelstar.  4. Anemia. Likely due to recent fall and hematomas. He does not currently have any active bleeding. No transfusion is indicated. 5. Lung fibrosis noted on CT scan: asymptomatic. We would consider Pulmonary referral if he becomes symptomatic.  6. Follow-up. In 4-5 weeks.  [facility] 9/4/20133:49 PM

## 2012-09-24 ENCOUNTER — Telehealth: Payer: Self-pay | Admitting: Oncology

## 2012-09-24 NOTE — Telephone Encounter (Signed)
Pt r/s appt from 10/9 to 10/15/12, Md aware

## 2012-09-25 ENCOUNTER — Ambulatory Visit: Payer: Medicare Other | Admitting: Oncology

## 2012-09-25 ENCOUNTER — Other Ambulatory Visit: Payer: Medicare Other | Admitting: Lab

## 2012-09-26 ENCOUNTER — Telehealth: Payer: Self-pay | Admitting: Oncology

## 2012-09-26 NOTE — Telephone Encounter (Signed)
pt called top r/s his appt from 10/29 to 11/8    aom

## 2012-10-15 ENCOUNTER — Ambulatory Visit: Payer: Medicare Other | Admitting: Oncology

## 2012-10-15 ENCOUNTER — Other Ambulatory Visit: Payer: Medicare Other | Admitting: Lab

## 2012-10-25 ENCOUNTER — Ambulatory Visit (HOSPITAL_BASED_OUTPATIENT_CLINIC_OR_DEPARTMENT_OTHER): Payer: Medicare Other | Admitting: Oncology

## 2012-10-25 ENCOUNTER — Other Ambulatory Visit (HOSPITAL_BASED_OUTPATIENT_CLINIC_OR_DEPARTMENT_OTHER): Payer: Medicare Other | Admitting: Lab

## 2012-10-25 ENCOUNTER — Telehealth: Payer: Self-pay | Admitting: Oncology

## 2012-10-25 VITALS — BP 132/56 | HR 54 | Temp 98.9°F | Resp 20 | Ht 69.0 in | Wt 167.9 lb

## 2012-10-25 DIAGNOSIS — C61 Malignant neoplasm of prostate: Secondary | ICD-10-CM

## 2012-10-25 DIAGNOSIS — D649 Anemia, unspecified: Secondary | ICD-10-CM

## 2012-10-25 DIAGNOSIS — J8410 Pulmonary fibrosis, unspecified: Secondary | ICD-10-CM

## 2012-10-25 LAB — CBC WITH DIFFERENTIAL/PLATELET
Basophils Absolute: 0 10*3/uL (ref 0.0–0.1)
Eosinophils Absolute: 0 10*3/uL (ref 0.0–0.5)
HGB: 12 g/dL — ABNORMAL LOW (ref 13.0–17.1)
LYMPH%: 5.1 % — ABNORMAL LOW (ref 14.0–49.0)
MCV: 92.8 fL (ref 79.3–98.0)
MONO#: 0.4 10*3/uL (ref 0.1–0.9)
NEUT#: 9.4 10*3/uL — ABNORMAL HIGH (ref 1.5–6.5)
Platelets: 150 10*3/uL (ref 140–400)
RBC: 3.75 10*6/uL — ABNORMAL LOW (ref 4.20–5.82)
RDW: 14.9 % — ABNORMAL HIGH (ref 11.0–14.6)
WBC: 10.3 10*3/uL (ref 4.0–10.3)

## 2012-10-25 LAB — COMPREHENSIVE METABOLIC PANEL (CC13)
Albumin: 3.1 g/dL — ABNORMAL LOW (ref 3.5–5.0)
BUN: 16 mg/dL (ref 7.0–26.0)
CO2: 25 mEq/L (ref 22–29)
Glucose: 194 mg/dl — ABNORMAL HIGH (ref 70–99)
Potassium: 5.1 mEq/L (ref 3.5–5.1)
Sodium: 132 mEq/L — ABNORMAL LOW (ref 136–145)
Total Protein: 5.7 g/dL — ABNORMAL LOW (ref 6.4–8.3)

## 2012-10-25 NOTE — Progress Notes (Signed)
Hematology and Oncology Follow Up Visit  Jackson Mccormick [id number] [birth year] 78 y.o. 10/25/2012 3:42 PM  CC: Excell Seltzer. [name], M.D.  [name], M.D.  [name]. [name], M.D.    Principle Diagnosis: This is an 78 year old Asian gentleman with prostate cancer initially diagnosed Gleason score 4 + 5 = 9 in October 2010 with a PSA of 223.  He has advanced bony metastasis.  Prior Therapy:  1. The patient was treated with hormonal deprivation, initially with Deborra Medina therapy and subsequently to [employer]  He had an excellent response, PSA dropped from 223 down to 0.08. 2. His PSA started to rise as high as 0.54 in May 2012. 3. Casodex was started around August 2012 through January 2013. Medication was stopped due to a rising PSA.  Current therapy:  1. Trelstar every 3 months at [facility] 2. Rivka Barbara monthly at [facility]  3. Ketoconazole 200 mg BID with prednisone 5 mg daily started in in 1/ 2013   Interim History: Jackson Mccormick presents today for a followup visit. He is doing well since his last visit. He is not reporting any major problems related to his prostate cancer or ketoconazole. He has not had any pathological fractures. His appetite and performance status are excellent at this time. He is asymptomatic. He is doing well with Ketoconazole and prednisone without complications. No chest pain, shortness of breath, abdominal pain, nausea, or vomiting. No bleeding. No recent illnesses or hospitalizations.   Medications: I have reviewed the patient's current medications. Current outpatient prescriptions:amLODipine (NORVASC) 5 MG tablet, Take 5 mg by mouth daily.  , Disp: , Rfl: ;  aspirin 81 MG tablet, Take 81 mg by mouth daily.  , Disp: , Rfl: ;  atenolol (TENORMIN) 25 MG tablet, Take 25 mg by mouth daily.  , Disp: , Rfl: ;  Calcium Carbonate-Vitamin D (CALCIUM + D PO), Take 1 tablet by mouth 2 (two) times daily. Cal 600 mg and Vit D 200 IU , Disp: , Rfl:  Cholecalciferol  (VITAMIN D-3 PO), Take 2,000 Units by mouth daily.  , Disp: , Rfl: ;  clopidogrel (PLAVIX) 75 MG tablet, Take 75 mg by mouth daily.  , Disp: , Rfl: ;  denosumab (XGEVA) 120 MG/1.7ML SOLN, Inject 120 mg into the skin every 30 (thirty) days., Disp: , Rfl: ;  fluticasone (FLONASE) 50 MCG/ACT nasal spray, Place 4 sprays into the nose daily.  , Disp: , Rfl:  Folic Acid-Vit B6-Vit B12 (FOLGARD) 0.8-10-0.115 MG TABS, Take 1 tablet by mouth daily.  , Disp: , Rfl: ;  insulin glargine (LANTUS) 100 UNIT/ML injection, Inject 17 Units into the skin 2 (two) times daily. , Disp: , Rfl: ;  insulin lispro (HUMALOG) 100 UNIT/ML injection, Inject 6-15 Units into the skin 2 (two) times daily with a meal. Before breakfast and dinner., Disp: , Rfl:  ipratropium (ATROVENT) 0.03 % nasal spray, Place 4 sprays into the nose every 12 (twelve) hours.  , Disp: , Rfl: ;  ketoconazole (NIZORAL) 200 MG tablet, TAKE 1 TABLET TWICE DAILY, Disp: 180 tablet, Rfl: PRN;  levocetirizine (XYZAL) 5 MG tablet, Take 5 mg by mouth every evening.  , Disp: , Rfl: ;  levothyroxine (SYNTHROID, LEVOTHROID) 200 MCG tablet, Take 200 mcg by mouth daily., Disp: , Rfl:  pantoprazole (PROTONIX) 40 MG tablet, Take 40 mg by mouth daily.  , Disp: , Rfl: ;  predniSONE (DELTASONE) 5 MG tablet, TAKE 1 TABLET DAILY, Disp: 90 tablet, Rfl: PRN;  rosuvastatin (CRESTOR) 10 MG  tablet, Take 10 mg by mouth daily.  , Disp: , Rfl: ;  simethicone (MYLICON) 125 MG chewable tablet, Chew 125 mg by mouth every 6 (six) hours as needed., Disp: , Rfl:  traMADol-acetaminophen (ULTRACET) 37.5-325 MG per tablet, Take 1 tablet by mouth 3 (three) times daily. , Disp: , Rfl: ;  Triptorelin Pamoate (TRELSTAR DEPOT IM), Inject into the muscle every 3 (three) months.  , Disp: , Rfl: ;  Trospium Chloride (SANCTURA XR PO), Take 60 mg by mouth daily.  , Disp: , Rfl: ;  valsartan-hydrochlorothiazide (DIOVAN-HCT) 320-25 MG per tablet, Take 1 tablet by mouth daily.  , Disp: , Rfl:  vitamin C (ASCORBIC  ACID) 500 MG tablet, Take 500 mg by mouth daily.  , Disp: , Rfl:   Allergies:  Allergies  Allergen Reactions  . Morphine And Related Other (See Comments)    Red streaks over my body     Past Medical History, Surgical history, Social history, and Family History were reviewed and updated.  Review of Systems: Constitutional:  Negative for fever, chills, night sweats, anorexia, weight loss, pain. Cardiovascular: no chest pain or dyspnea on exertion Respiratory: no cough, shortness of breath, or wheezing Neurological: no TIA or stroke symptoms Dermatological: negative ENT: negative Skin: Negative. Gastrointestinal: no abdominal pain, change in bowel habits, or black or bloody stools Genito-Urinary: no dysuria, trouble voiding, or hematuria Hematological and Lymphatic: negative Breast: negative Musculoskeletal: negative Remaining ROS negative.  Physical Exam: Blood pressure 132/56, pulse 54, temperature 98.9 F (37.2 C), resp. rate 20, height 5\' 9"  (1.753 m), weight 167 lb 14.4 oz (76.159 kg). ECOG: 1 General appearance: alert Head: Normocephalic, without obvious abnormality, atraumatic Neck: no adenopathy, no carotid bruit, no JVD, supple, symmetrical, trachea midline and thyroid not enlarged, symmetric, no tenderness/mass/nodules Lymph nodes: Cervical, supraclavicular, and axillary nodes normal. Heart:regular rate and rhythm, S1, S2 normal, no murmur, click, rub or gallop Lung:chest clear, no wheezing, rales, normal symmetric air entry. Dry rales noted at the bases.  Abdomen: soft, non-tender, without masses or organomegaly EXT:no erythema, induration, or nodules. Left thigh: vesicular rash noted. All lesions are crusted. No edema noted.    Lab Results: Lab Results  Component Value Date   WBC 10.3 10/25/2012   HGB 12.0* 10/25/2012   HCT 34.8* 10/25/2012   MCV 92.8 10/25/2012   PLT 150 10/25/2012     Chemistry      Component Value Date/Time   NA 138 08/21/2012 1507   NA 139  07/11/2012 1424   K 5.2* 08/21/2012 1507   K 5.0 07/11/2012 1424   CL 106 08/21/2012 1507   CL 107 07/11/2012 1424   CO2 24 08/21/2012 1507   CO2 24 07/11/2012 1424   BUN 13.0 08/21/2012 1507   BUN 17 07/11/2012 1424   CREATININE 1.2 08/21/2012 1507   CREATININE 1.17 07/11/2012 1424      Component Value Date/Time   CALCIUM 8.7 07/11/2012 1424   ALKPHOS 49 08/21/2012 1507   ALKPHOS 43 07/11/2012 1424   AST 20 08/21/2012 1507   AST 18 07/11/2012 1424   ALT 16 08/21/2012 1507   ALT 14 07/11/2012 1424   BILITOT 0.40 08/21/2012 1507   BILITOT 0.4 07/11/2012 1424      Results for Jackson Mccormick, Jackson Mccormick (MRN [id number]) as of 10/25/2012 15:23  Ref. Range 07/11/2012 14:24 08/21/2012 15:08  PSA Latest Range: <=4.00 ng/mL 1.64 1.43    Impression and Plan:  This is a pleasant 78 year old gentleman with the following issues:  1. 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  He is on Ketoconazole as second line hormonal manipulations since 12/2011. He tolerated it well with out complications. PSA  Is pending from today but it has declined from 4.21 to 1.43. If PSA continue to respond, then will continue the current medication and schedule. If PSA rises again, then will consider a different agent such as Zytiga.  2. Bony disease. Continue Xgeva on a monthly basis.This is given at [facility]. 3. Hormonal deprivation: He is to continue on Trelstar. This is given at [facility]. 4. Anemia. Likely due to recent fall and hematomas. Improving now.  5. Lung fibrosis noted on CT scan: asymptomatic. We would consider Pulmonary referral if he becomes symptomatic.  6. Follow-up. In 8 weeks.  [facility] 11/8/20133:42 PM

## 2012-10-25 NOTE — Telephone Encounter (Signed)
gv and printed pt appt schedule for Jan 2014 °

## 2012-10-26 LAB — PSA: PSA: 1.47 ng/mL (ref <=4.00)

## 2012-12-25 ENCOUNTER — Other Ambulatory Visit (HOSPITAL_BASED_OUTPATIENT_CLINIC_OR_DEPARTMENT_OTHER): Payer: Medicare Other | Admitting: Lab

## 2012-12-25 ENCOUNTER — Telehealth: Payer: Self-pay | Admitting: Oncology

## 2012-12-25 ENCOUNTER — Ambulatory Visit (HOSPITAL_BASED_OUTPATIENT_CLINIC_OR_DEPARTMENT_OTHER): Payer: Medicare Other | Admitting: Oncology

## 2012-12-25 VITALS — BP 121/60 | HR 67 | Temp 96.8°F | Resp 20 | Ht 69.0 in | Wt 171.0 lb

## 2012-12-25 DIAGNOSIS — D649 Anemia, unspecified: Secondary | ICD-10-CM

## 2012-12-25 DIAGNOSIS — C61 Malignant neoplasm of prostate: Secondary | ICD-10-CM

## 2012-12-25 DIAGNOSIS — J8410 Pulmonary fibrosis, unspecified: Secondary | ICD-10-CM

## 2012-12-25 DIAGNOSIS — C7951 Secondary malignant neoplasm of bone: Secondary | ICD-10-CM

## 2012-12-25 LAB — CBC WITH DIFFERENTIAL/PLATELET
BASO%: 0.4 % (ref 0.0–2.0)
MCHC: 33 g/dL (ref 32.0–36.0)
MONO#: 0.6 10*3/uL (ref 0.1–0.9)
NEUT#: 11.9 10*3/uL — ABNORMAL HIGH (ref 1.5–6.5)
RBC: 3.36 10*6/uL — ABNORMAL LOW (ref 4.20–5.82)
RDW: 15.1 % — ABNORMAL HIGH (ref 11.0–14.6)
WBC: 13.8 10*3/uL — ABNORMAL HIGH (ref 4.0–10.3)
lymph#: 1 10*3/uL (ref 0.9–3.3)

## 2012-12-25 LAB — COMPREHENSIVE METABOLIC PANEL (CC13)
ALT: 17 U/L (ref 0–55)
Albumin: 2.3 g/dL — ABNORMAL LOW (ref 3.5–5.0)
CO2: 25 mEq/L (ref 22–29)
Calcium: 8.3 mg/dL — ABNORMAL LOW (ref 8.4–10.4)
Chloride: 104 mEq/L (ref 98–107)
Potassium: 4.9 mEq/L (ref 3.5–5.1)
Sodium: 136 mEq/L (ref 136–145)
Total Bilirubin: 0.36 mg/dL (ref 0.20–1.20)
Total Protein: 5.6 g/dL — ABNORMAL LOW (ref 6.4–8.3)

## 2012-12-25 LAB — PSA: PSA: 0.75 ng/mL (ref <=4.00)

## 2012-12-25 NOTE — Telephone Encounter (Signed)
Gave pt appt for march 2014 lab and MD

## 2012-12-25 NOTE — Progress Notes (Signed)
Hematology and Oncology Follow Up Visit  JAMESROBERT OHANESIAN [id number] [birth year] 79 y.o. 12/25/2012 3:55 PM  CC: Excell Seltzer. [name], M.D.  [name], M.D.  [name]. [name], M.D.    Principle Diagnosis: This is an 79 year old Asian gentleman with prostate cancer initially diagnosed Gleason score 4 + 5 = 9 in October 2010 with a PSA of 223.  He has advanced bony metastasis.  Prior Therapy:  1. The patient was treated with hormonal deprivation, initially with Deborra Medina therapy and subsequently to [employer]  He had an excellent response, PSA dropped from 223 down to 0.08. 2. His PSA started to rise as high as 0.54 in May 2012. 3. Casodex was started around August 2012 through January 2013. Medication was stopped due to a rising PSA.  Current therapy:  1. Trelstar every 3 months at [facility] 2. Rivka Barbara monthly at [facility]  3. Ketoconazole 200 mg BID with prednisone 5 mg daily started in in 1/ 2013   Interim History: Mr. Tozzi presents today for a followup visit. He is doing well since his last visit. He is not reporting any major problems related to his prostate cancer or ketoconazole. He has not had any pathological fractures. His appetite and performance status are excellent at this time. He is asymptomatic. He is doing well with Ketoconazole and prednisone without complications. No chest pain, shortness of breath, abdominal pain, nausea, or vomiting. No bleeding noted. He did have left leg wound that is being evaluated by the wound clinic.   Medications: I have reviewed the patient's current medications. Current outpatient prescriptions:amLODipine (NORVASC) 5 MG tablet, Take 5 mg by mouth daily.  , Disp: , Rfl: ;  aspirin 81 MG tablet, Take 81 mg by mouth daily.  , Disp: , Rfl: ;  atenolol (TENORMIN) 25 MG tablet, Take 25 mg by mouth daily.  , Disp: , Rfl: ;  Calcium Carbonate-Vitamin D (CALCIUM + D PO), Take 1 tablet by mouth 2 (two) times daily. Cal 600 mg and Vit D 200 IU ,  Disp: , Rfl:  Cholecalciferol (VITAMIN D-3 PO), Take 2,000 Units by mouth daily.  , Disp: , Rfl: ;  clopidogrel (PLAVIX) 75 MG tablet, Take 75 mg by mouth daily.  , Disp: , Rfl: ;  denosumab (XGEVA) 120 MG/1.7ML SOLN, Inject 120 mg into the skin every 30 (thirty) days., Disp: , Rfl: ;  fluticasone (FLONASE) 50 MCG/ACT nasal spray, Place 4 sprays into the nose daily.  , Disp: , Rfl:  Folic Acid-Vit B6-Vit B12 (FOLGARD) 0.8-10-0.115 MG TABS, Take 1 tablet by mouth daily.  , Disp: , Rfl: ;  insulin glargine (LANTUS) 100 UNIT/ML injection, Inject 17 Units into the skin 2 (two) times daily. , Disp: , Rfl: ;  insulin lispro (HUMALOG) 100 UNIT/ML injection, Inject 6-15 Units into the skin 2 (two) times daily with a meal. Before breakfast and dinner., Disp: , Rfl:  ipratropium (ATROVENT) 0.03 % nasal spray, Place 4 sprays into the nose every 12 (twelve) hours.  , Disp: , Rfl: ;  ketoconazole (NIZORAL) 200 MG tablet, TAKE 1 TABLET TWICE DAILY, Disp: 180 tablet, Rfl: PRN;  levocetirizine (XYZAL) 5 MG tablet, Take 5 mg by mouth every evening.  , Disp: , Rfl: ;  levothyroxine (SYNTHROID, LEVOTHROID) 200 MCG tablet, Take 200 mcg by mouth daily., Disp: , Rfl:  pantoprazole (PROTONIX) 40 MG tablet, Take 40 mg by mouth daily.  , Disp: , Rfl: ;  predniSONE (DELTASONE) 5 MG tablet, TAKE 1 TABLET DAILY,  Disp: 90 tablet, Rfl: PRN;  rosuvastatin (CRESTOR) 10 MG tablet, Take 10 mg by mouth daily.  , Disp: , Rfl: ;  simethicone (MYLICON) 125 MG chewable tablet, Chew 125 mg by mouth every 6 (six) hours as needed., Disp: , Rfl:  traMADol-acetaminophen (ULTRACET) 37.5-325 MG per tablet, Take 1 tablet by mouth 3 (three) times daily. , Disp: , Rfl: ;  Triptorelin Pamoate (TRELSTAR DEPOT IM), Inject into the muscle every 3 (three) months.  , Disp: , Rfl: ;  Trospium Chloride (SANCTURA XR PO), Take 60 mg by mouth daily.  , Disp: , Rfl: ;  valsartan-hydrochlorothiazide (DIOVAN-HCT) 320-25 MG per tablet, Take 1 tablet by mouth daily.  , Disp:  , Rfl:  vitamin C (ASCORBIC ACID) 500 MG tablet, Take 500 mg by mouth daily.  , Disp: , Rfl:   Allergies:  Allergies  Allergen Reactions  . Morphine And Related Other (See Comments)    Red streaks over my body     Past Medical History, Surgical history, Social history, and Family History were reviewed and updated.  Review of Systems: Constitutional:  Negative for fever, chills, night sweats, anorexia, weight loss, pain. Cardiovascular: no chest pain or dyspnea on exertion Respiratory: no cough, shortness of breath, or wheezing Neurological: no TIA or stroke symptoms Dermatological: negative ENT: negative Skin: Negative. Gastrointestinal: no abdominal pain, change in bowel habits, or black or bloody stools Genito-Urinary: no dysuria, trouble voiding, or hematuria Hematological and Lymphatic: negative Breast: negative Musculoskeletal: negative Remaining ROS negative.  Physical Exam: Blood pressure 121/60, pulse 67, temperature 96.8 F (36 C), temperature source Oral, resp. rate 20, height 5\' 9"  (1.753 m), weight 171 lb (77.565 kg). ECOG: 1 General appearance: alert Head: Normocephalic, without obvious abnormality, atraumatic Neck: no adenopathy, no carotid bruit, no JVD, supple, symmetrical, trachea midline and thyroid not enlarged, symmetric, no tenderness/mass/nodules Lymph nodes: Cervical, supraclavicular, and axillary nodes normal. Heart:regular rate and rhythm, S1, S2 normal, no murmur, click, rub or gallop Lung:chest clear, no wheezing, rales, normal symmetric air entry. Dry rales noted at the bases.  Abdomen: soft, non-tender, without masses or organomegaly EXT:no erythema, induration, or nodules. Left thigh: vesicular rash noted. All lesions are crusted. No edema noted.    Lab Results: Lab Results  Component Value Date   WBC 13.8* 12/25/2012   HGB 10.5* 12/25/2012   HCT 31.8* 12/25/2012   MCV 94.7 12/25/2012   PLT 301 12/25/2012     Chemistry      Component Value  Date/Time   NA 136 12/25/2012 1503   NA 139 07/11/2012 1424   K 4.9 12/25/2012 1503   K 5.0 07/11/2012 1424   CL 104 12/25/2012 1503   CL 107 07/11/2012 1424   CO2 25 12/25/2012 1503   CO2 24 07/11/2012 1424   BUN 16.0 12/25/2012 1503   BUN 17 07/11/2012 1424   CREATININE 1.2 12/25/2012 1503   CREATININE 1.17 07/11/2012 1424      Component Value Date/Time   CALCIUM 8.3* 12/25/2012 1503   CALCIUM 8.7 07/11/2012 1424   ALKPHOS 66 12/25/2012 1503   ALKPHOS 43 07/11/2012 1424   AST 24 12/25/2012 1503   AST 18 07/11/2012 1424   ALT 17 12/25/2012 1503   ALT 14 07/11/2012 1424   BILITOT 0.36 12/25/2012 1503   BILITOT 0.4 07/11/2012 1424       Results for MATHIUS, BIRKELAND (MRN [id number]) as of 12/25/2012 15:28  Ref. Range 08/21/2012 15:08 10/25/2012 15:14  PSA Latest Range: <=4.00 ng/mL 1.43 1.47  Impression and Plan:  This is a pleasant 79 year old gentleman with the following issues:  1. 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  He is on Ketoconazole as second line hormonal manipulations since 12/2011. He tolerated it well with out complications. PSA  Is pending from today but it has declined from 4.21 to 1.47. If PSA continue to respond, then will continue the current medication and schedule. If PSA rises again, then will consider a different agent such as Zytiga.  2. Bony disease. Continue Xgeva on a monthly basis.This is given at [facility]. 3. Hormonal deprivation: He is to continue on Trelstar. This is given at [facility]. 4. Anemia. Likely due to recent fall and hematomas. Improving now.  5. Lung fibrosis noted on CT scan: asymptomatic. We would consider Pulmonary referral if he becomes symptomatic.  6. Follow-up. In 8 weeks.  Blimie Vaness 1/8/20143:55 PM

## 2012-12-26 ENCOUNTER — Telehealth: Payer: Self-pay | Admitting: *Deleted

## 2012-12-26 NOTE — Progress Notes (Signed)
Quick Note:  Called results of PSA done on 12/25/12 ______

## 2012-12-26 NOTE — Telephone Encounter (Signed)
Message copied by Reesa Chew on Thu Dec 26, 2012 12:10 PM ------      Message from: Benjiman Core      Created: Thu Dec 26, 2012  8:51 AM       Please call his PSA

## 2013-02-20 ENCOUNTER — Other Ambulatory Visit: Payer: Medicare Other | Admitting: Lab

## 2013-02-21 ENCOUNTER — Other Ambulatory Visit: Payer: Medicare Other | Admitting: Lab

## 2013-02-21 ENCOUNTER — Ambulatory Visit: Payer: Medicare Other | Admitting: Oncology

## 2013-02-25 ENCOUNTER — Ambulatory Visit: Payer: Medicare Other | Admitting: Oncology

## 2013-02-27 ENCOUNTER — Telehealth: Payer: Self-pay | Admitting: Oncology

## 2013-02-27 NOTE — Telephone Encounter (Signed)
returned pt call and r/s missed appt

## 2013-04-15 ENCOUNTER — Other Ambulatory Visit (HOSPITAL_BASED_OUTPATIENT_CLINIC_OR_DEPARTMENT_OTHER): Payer: Medicare Other | Admitting: Lab

## 2013-04-15 ENCOUNTER — Ambulatory Visit (HOSPITAL_BASED_OUTPATIENT_CLINIC_OR_DEPARTMENT_OTHER): Payer: Medicare Other | Admitting: Oncology

## 2013-04-15 ENCOUNTER — Telehealth: Payer: Self-pay | Admitting: Oncology

## 2013-04-15 VITALS — BP 122/57 | HR 73 | Temp 97.0°F | Resp 18 | Ht 69.0 in | Wt 156.0 lb

## 2013-04-15 DIAGNOSIS — E875 Hyperkalemia: Secondary | ICD-10-CM

## 2013-04-15 DIAGNOSIS — C7952 Secondary malignant neoplasm of bone marrow: Secondary | ICD-10-CM

## 2013-04-15 DIAGNOSIS — J8410 Pulmonary fibrosis, unspecified: Secondary | ICD-10-CM

## 2013-04-15 DIAGNOSIS — C7951 Secondary malignant neoplasm of bone: Secondary | ICD-10-CM

## 2013-04-15 DIAGNOSIS — C61 Malignant neoplasm of prostate: Secondary | ICD-10-CM

## 2013-04-15 LAB — CBC WITH DIFFERENTIAL/PLATELET
BASO%: 0.6 % (ref 0.0–2.0)
Basophils Absolute: 0.1 10*3/uL (ref 0.0–0.1)
EOS%: 1.6 % (ref 0.0–7.0)
Eosinophils Absolute: 0.2 10*3/uL (ref 0.0–0.5)
HCT: 32.2 % — ABNORMAL LOW (ref 38.4–49.9)
HGB: 10.4 g/dL — ABNORMAL LOW (ref 13.0–17.1)
LYMPH%: 6.3 % — ABNORMAL LOW (ref 14.0–49.0)
MCH: 29.9 pg (ref 27.2–33.4)
MCHC: 32.3 g/dL (ref 32.0–36.0)
MCV: 92.8 fL (ref 79.3–98.0)
MONO#: 0.4 10*3/uL (ref 0.1–0.9)
MONO%: 3.8 % (ref 0.0–14.0)
NEUT#: 9.5 10*3/uL — ABNORMAL HIGH (ref 1.5–6.5)
NEUT%: 87.7 % — ABNORMAL HIGH (ref 39.0–75.0)
Platelets: 259 10*3/uL (ref 140–400)
RBC: 3.47 10*6/uL — ABNORMAL LOW (ref 4.20–5.82)
RDW: 15.1 % — ABNORMAL HIGH (ref 11.0–14.6)
WBC: 10.9 10*3/uL — ABNORMAL HIGH (ref 4.0–10.3)
lymph#: 0.7 10*3/uL — ABNORMAL LOW (ref 0.9–3.3)

## 2013-04-15 LAB — COMPREHENSIVE METABOLIC PANEL (CC13)
ALT: 14 U/L (ref 0–55)
AST: 20 U/L (ref 5–34)
Chloride: 110 mEq/L — ABNORMAL HIGH (ref 98–107)
Creatinine: 1.3 mg/dL (ref 0.7–1.3)
Total Bilirubin: 0.39 mg/dL (ref 0.20–1.20)

## 2013-04-15 LAB — PSA: PSA: 1.11 ng/mL (ref <=4.00)

## 2013-04-15 NOTE — Telephone Encounter (Signed)
gv and printed appt sched and avs for pt for June... °

## 2013-04-15 NOTE — Progress Notes (Signed)
Hematology and Oncology Follow Up Visit  Jackson Mccormick [id number] 09-Dec-1928 79 y.o. 04/15/2013 4:20 PM  CC: Jackson Mccormick. Jackson Mccormick, M.D.  Jackson Mccormick, M.D.  Jackson Mccormick. Jackson Mccormick, M.D.    Principle Diagnosis: This is an 79 year old Asian gentleman with prostate cancer initially diagnosed Gleason score 4 + 5 = 9 in October 2010 with a PSA of 223.  He has advanced bony metastasis.  Prior Therapy:  1. The patient was treated with hormonal deprivation, initially with Deborra Medina therapy and subsequently to [employer]  He had an excellent response, PSA dropped from 223 down to 0.08. 2. His PSA started to rise as high as 0.54 in May 2012. 3. Casodex was started around August 2012 through January 2013. Medication was stopped due to a rising PSA.  Current therapy:  1. Trelstar every 3 months at [facility] 2. Rivka Barbara monthly at [facility]  3. Ketoconazole 200 mg BID with prednisone 5 mg daily started in in 1/ 2013   Interim History: Jackson Mccormick presents today for a followup visit. He is doing well since his last visit. He is not reporting any major problems related to his prostate cancer or ketoconazole. He has not had any pathological fractures. His appetite and performance status are unchanged. He did develop generalized anasarca and was started on Aldactone by Jackson Mccormick. His edema has improved since then. He is doing well with Ketoconazole and prednisone without complications. No chest pain, shortness of breath, abdominal pain, nausea, or vomiting. No bleeding noted.    Medications: I have reviewed the patient's current medications. Current outpatient prescriptions:atenolol (TENORMIN) 25 MG tablet, Take 25 mg by mouth daily.  , Disp: , Rfl: ;  Calcium Carbonate-Vitamin D (CALCIUM + D PO), Take 1 tablet by mouth 2 (two) times daily. Cal 600 mg and Vit D 200 IU , Disp: , Rfl: ;  Cholecalciferol (VITAMIN D-3 PO), Take 2,000 Units by mouth daily.  , Disp: , Rfl: ;  clopidogrel (PLAVIX) 75 MG  tablet, Take 75 mg by mouth daily.  , Disp: , Rfl:  denosumab (XGEVA) 120 MG/1.7ML SOLN, Inject 120 mg into the skin every 30 (thirty) days., Disp: , Rfl: ;  fluticasone (FLONASE) 50 MCG/ACT nasal spray, Place 4 sprays into the nose daily.  , Disp: , Rfl: ;  Folic Acid-Vit B6-Vit B12 (FOLGARD) 0.8-10-0.115 MG TABS, Take 1 tablet by mouth daily.  , Disp: , Rfl: ;  insulin glargine (LANTUS) 100 UNIT/ML injection, Inject 17 Units into the skin 2 (two) times daily. , Disp: , Rfl:  insulin lispro (HUMALOG) 100 UNIT/ML injection, Inject 6-15 Units into the skin 2 (two) times daily with a meal. Before breakfast and dinner., Disp: , Rfl: ;  ipratropium (ATROVENT) 0.03 % nasal spray, Place 4 sprays into the nose every 12 (twelve) hours.  , Disp: , Rfl: ;  ketoconazole (NIZORAL) 200 MG tablet, TAKE 1 TABLET TWICE DAILY, Disp: 180 tablet, Rfl: PRN levocetirizine (XYZAL) 5 MG tablet, Take 5 mg by mouth every evening.  , Disp: , Rfl: ;  levothyroxine (SYNTHROID, LEVOTHROID) 200 MCG tablet, Take 200 mcg by mouth daily., Disp: , Rfl: ;  pantoprazole (PROTONIX) 40 MG tablet, Take 40 mg by mouth daily.  , Disp: , Rfl: ;  predniSONE (DELTASONE) 5 MG tablet, TAKE 1 TABLET DAILY, Disp: 90 tablet, Rfl: PRN;  rosuvastatin (CRESTOR) 10 MG tablet, Take 10 mg by mouth daily.  , Disp: , Rfl:  simethicone (MYLICON) 125 MG chewable tablet, Chew 125 mg by mouth  every 6 (six) hours as needed., Disp: , Rfl: ;  spironolactone (ALDACTONE) 25 MG tablet, Take 25 mg by mouth daily. Take 1/2 tablet by mouth with other am medications once a day, Disp: , Rfl: ;  traMADol-acetaminophen (ULTRACET) 37.5-325 MG per tablet, Take 1 tablet by mouth 3 (three) times daily. , Disp: , Rfl:  Triptorelin Pamoate (TRELSTAR DEPOT IM), Inject into the muscle every 3 (three) months.  , Disp: , Rfl: ;  Trospium Chloride (SANCTURA XR PO), Take 60 mg by mouth daily.  , Disp: , Rfl: ;  valsartan-hydrochlorothiazide (DIOVAN-HCT) 320-25 MG per tablet, Take 1 tablet by  mouth daily.  , Disp: , Rfl: ;  vitamin C (ASCORBIC ACID) 500 MG tablet, Take 500 mg by mouth daily.  , Disp: , Rfl:  amLODipine (NORVASC) 5 MG tablet, Take 5 mg by mouth daily.  , Disp: , Rfl: ;  aspirin 81 MG tablet, Take 81 mg by mouth daily.  , Disp: , Rfl:   Allergies:  Allergies  Allergen Reactions  . Morphine And Related Other (See Comments)    Red streaks over my body     Past Medical History, Surgical history, Social history, and Family History were reviewed and updated.  Review of Systems: Constitutional:  Negative for fever, chills, night sweats, anorexia, weight loss, pain. Cardiovascular: no chest pain or dyspnea on exertion Respiratory: no cough, shortness of breath, or wheezing Neurological: no TIA or stroke symptoms Dermatological: negative ENT: negative Skin: Negative. Gastrointestinal: no abdominal pain, change in bowel habits, or black or bloody stools Genito-Urinary: no dysuria, trouble voiding, or hematuria Hematological and Lymphatic: negative Breast: negative Musculoskeletal: negative Remaining ROS negative.  Physical Exam: Blood pressure 122/57, pulse 73, temperature 97 F (36.1 C), temperature source Oral, resp. rate 18, height 5\' 9"  (1.753 m), weight 156 lb (70.761 kg), SpO2 98.00%. ECOG: 1 General appearance: alert Head: Normocephalic, without obvious abnormality, atraumatic Neck: no adenopathy, no carotid bruit, no JVD, supple, symmetrical, trachea midline and thyroid not enlarged, symmetric, no tenderness/mass/nodules Lymph nodes: Cervical, supraclavicular, and axillary nodes normal. Heart:regular rate and rhythm, S1, S2 normal, no murmur, click, rub or gallop Lung:chest clear, no wheezing, rales, normal symmetric air entry. Dry rales noted at the bases.  Abdomen: soft, non-tender, without masses or organomegaly EXT:no erythema, induration, or nodules. Left thigh: vesicular rash noted. All lesions are crusted. No edema noted.    Lab Results: Lab  Results  Component Value Date   WBC 10.9* 04/15/2013   HGB 10.4* 04/15/2013   HCT 32.2* 04/15/2013   MCV 92.8 04/15/2013   PLT 259 04/15/2013     Chemistry      Component Value Date/Time   NA 136 04/15/2013 1459   NA 139 07/11/2012 1424   K 6.6 non-hemolyzed specimen,Repeated and Verified* 04/15/2013 1459   K 5.0 07/11/2012 1424   CL 110* 04/15/2013 1459   CL 107 07/11/2012 1424   CO2 21* 04/15/2013 1459   CO2 24 07/11/2012 1424   BUN 19.7 04/15/2013 1459   BUN 17 07/11/2012 1424   CREATININE 1.3 04/15/2013 1459   CREATININE 1.17 07/11/2012 1424      Component Value Date/Time   CALCIUM 8.0* 04/15/2013 1459   CALCIUM 8.7 07/11/2012 1424   ALKPHOS 59 04/15/2013 1459   ALKPHOS 43 07/11/2012 1424   AST 20 04/15/2013 1459   AST 18 07/11/2012 1424   ALT 14 04/15/2013 1459   ALT 14 07/11/2012 1424   BILITOT 0.39 04/15/2013 1459   BILITOT 0.4  07/11/2012 1424     Results for FRIEND, DORFMAN (MRN [id number]) as of 04/15/2013 16:15  Ref. Range 10/25/2012 15:14 12/25/2012 15:03  PSA Latest Range: <=4.00 ng/mL 1.47 0.75      Impression and Plan:  This is a pleasant 79 year old gentleman with the following issues:  1. 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  He is on Ketoconazole as second line hormonal manipulations since 12/2011. He tolerated it well with out complications. PSA  Is pending from today but it has declined from 4.21 to 0.75. If PSA continue to respond, then will continue the current medication and schedule. If PSA rises again, then will consider a different agent such as Zytiga.  2. Bony disease. Continue Xgeva on a monthly basis.This is given at [facility]. 3. Hormonal deprivation: He is to continue on Trelstar. This is given at [facility]. 4. Hyperkalemia: Likely due to Aldactone. I instructed him to stop for now till he hears from Jackson Mccormick whom I spoken with directly and fax his office his lab results from today.  5. Lung fibrosis noted on CT scan: asymptomatic. We would  consider Pulmonary referral if he becomes symptomatic.  6. Follow-up. In 8 weeks.  [facility] 4/29/20144:20 PM

## 2013-04-16 ENCOUNTER — Telehealth: Payer: Self-pay | Admitting: *Deleted

## 2013-04-16 NOTE — Telephone Encounter (Signed)
Per MD, notified pt PSA results 1.11, little change. Pt verbalized understanding. No further concerns

## 2013-04-16 NOTE — Telephone Encounter (Signed)
Message copied by GARNER, Gerald Leitz on Wed Apr 16, 2013 11:50 AM ------      Message from: Benjiman Core      Created: Wed Apr 16, 2013  8:34 AM       Please call his PSA. Little change. ------

## 2013-04-21 ENCOUNTER — Encounter: Payer: Self-pay | Admitting: Cardiology

## 2013-04-21 ENCOUNTER — Ambulatory Visit
Admission: RE | Admit: 2013-04-21 | Discharge: 2013-04-21 | Disposition: A | Discharge status: Home / Self Care | Payer: Medicare Other | Source: Ambulatory Visit | Attending: Cardiology | Admitting: Cardiology

## 2013-04-21 ENCOUNTER — Other Ambulatory Visit: Payer: Self-pay | Admitting: Cardiology

## 2013-04-21 DIAGNOSIS — R0602 Shortness of breath: Secondary | ICD-10-CM

## 2013-04-21 NOTE — Progress Notes (Signed)
Patient ID: Jackson Mccormick, male   DOB: 19-Sep-1928, 79 y.o.   MRN: [id number]  Jackson Mccormick  Date of visit:  04/21/2013 DOB:  [birth year]    Age:  79 yrs. Medical record number:  [id number]     Account number:  [id number] Primary Care Provider: Hali Marry Mccormick ____________________________ CURRENT DIAGNOSES  1. Edema  2. Dyspnea  3. CAD,Native  4. Hyperlipidemia  5. Hypertension-Essential (Benign)  6. Mi-s/p Anterior  7. Surgery-Aortocoronary Bypass Grafting  8. Stent Placement  9. Diabetes Mellitus-IDD/circ dis/controll  10. Dyspnea  11. Personal History Of Malignant Neoplasm Of Prostate ____________________________ ALLERGIES  morphine, Rash ____________________________ MEDICATIONS  1. Ipratropium Bromide Nasal 0.06%, 2 spray each nostril q am  2. fluticasone 50 mcg/actuation Disk with Device, 2 sprays each nostril hs  3. Diovan HCT 320-25 mg tablet, 1 p.o. daily  4. Folgard 0.8-10-115 mg-mg-mcg tablet, 1 p.o. q.Mccormick.  5. Humalog 100 unit/mL Solution, sl scale bid  6. Vitamin C 500 mg tablet, 1 p.o. q.Mccormick.  7. Vitamin D3 400 unit capsule, 1 p.o. daily  8. Tylenol 8 Hour 650 mg Tablet Sustained Release, BID  9. Gas-X Extra Strength 125 mg Tablet, Chewable, TID  10. Calcium 600 + Mccormick(3) 600-200 mg-unit Capsule, 1 p.o. daily  11. Pantoprazole 40 mg Tablet, Delayed Release (E.C.), 1 p.o. daily  12. tramadol-acetaminophen 37.5-325 mg Tablet, PRN  13. levocetirizine 5 mg Tablet, 1 p.o. daily  14. aspirin 81 mg Tablet, Chewable, 1 p.o. q.Mccormick.  15. Plavix 75 mg Tablet, 1 p.o. q.Mccormick.  16. trospium 60 mg capsule,extended release 24hr, 1 p.o. daily  17. ketoconazole 200 mg tablet, BID  18. prednisone 5 mg tablet, 1 p.o. daily  19. Saline Solution Solution, BID  20. nitroglycerin 0.4 mg tablet, sublingual, PRN  21. atenolol 50 mg tablet, 1/2 tab q.Mccormick.  22. Crestor 10 mg tablet, QOD  23. Lantus 100 unit/mL solution, 6u bid  24. Synthroid 112 mcg tablet, 2 p.o. daily  25. Flector 1.3 % patch 12  hour, PRN ____________________________ CHIEF COMPLAINTS  Recent fluid retention ____________________________ HISTORY OF PRESENT ILLNESS  Patient seen early for evaluation of edema and a low albumin. He has lost about 15 pounds since I saw him last year and appears to be weakening. He continues under treatment for metastatic prostate cancer with a high Gleason score. He has had some mild dyspnea but developed significant edema and was found to have a low albumin by his primary physician. He has been placed on some Aldactone but developed hyperkalemia and this was discontinued by the hematologist. I was asked to see him because of worsening edema. He has similar problem a couple of years ago that resolved with diuresis as well as support stockings. Ventricular function was reasonably well preserved at that time. He does not have PND or orthopnea and is not currently having angina. He finds it difficult to get out in the morning and tends to sleep late most of the day and is fatigued and finds it difficult to get started during the day. ____________________________ PAST HISTORY  Past Medical Illnesses:  hypertension, DM-insulin dependent, hyperlipidemia, hypothyroidism, spinal stenosis, obstructed duodenal ulcer 8/08, prostate cancer met to bone 11/10 tx hormonally;  Cardiovascular Illnesses:  CAD, S/P MI-anterior 1992;  Surgical Procedures:  CABG w LIMA to LAD, SVG to AM-PD-PL, SVG to OM 01/09/97 Dr. Andrey Mccormick, amputation of finger, knee surgery, left May 2005, vagotomy and gastrojejunostomy 11/09 Dr. Derrell Mccormick;  Cardiology Procedures-Invasive:  cardiac cath (left), PTCA of the  LAD Diag 1 1992, Taxus stent RCA May 2004 SVG;  Cardiology Procedures-Noninvasive:  adenosine cardiolite June 2009, echocardiogram July 2012;  Cardiac Cath Results:  50% stenosis Left main, 90% stenosis proximal LAD, 90% stenosis mid, 80% stenosis Ramus, 60% stenosis mid RCA, 90% stenosis proximal RCA, 99% stenosis PLR;  LVEF of 45%  documented via echocardiogram on 07/05/2011,   ____________________________ CARDIO-PULMONARY TEST DATES EKG Date:  04/21/2013;   Cardiac Cath Date:  04/30/2003;  CABG: 01/09/1997;  Stent Placement Date: 05/07/2003;  Nuclear Study Date:  06/11/2008;  Echocardiography Date: 07/05/2011;  Chest Xray Date: 06/09/2011;   ____________________________ SOCIAL HISTORY Alcohol Use:  drinks occasionally;  Smoking:  used to smoke but quit Prior to 1980;  Diet:  regular diet without modifications;  Lifestyle:  married;  Exercise:  exercise is limited due to physical disability;  Occupation:  retired and Jackson Mccormick;  Residence:  lives with wife;   ____________________________ REVIEW OF SYSTEMS General:  malaise and fatigue, weight loss of approximately 15 lbs  Integumentary:easy bruisability Eyes: no diabetic retinopathy, wears eye glasses/contact lenses Ears, Nose, Throat, Mouth:  seasonal sinusitis Respiratory: denies dyspnea, cough, wheezing or hemoptysis. Cardiovascular:  please review HPI Abdominal: dyspepsia Genitourinary-Male: see history of present illness  Musculoskeletal:  arthritis of the left knee, severe chronic low back pain, arthritis of the right shoulder, edema and chronic venous disease  ____________________________ PHYSICAL EXAMINATION VITAL SIGNS  Blood Pressure:  110/60 Sitting, Right arm, regular cuff  , 104/60 Standing, Right arm and regular cuff   Pulse:  74/min. Weight:  155.00 lbs. Height:  70"BMI: 22  Constitutional:  thin, pleasant white male in no acute distress Skin:  scattered ecchymosis present Head:  normocephalic, normal hair pattern, no masses or tenderness ENT:  ears, nose and throat reveal no gross abnormalities.  Dentition good. Neck:  supple, no masses, thyromegaly, JVD. Carotid pulses are full and equal bilaterally without bruits. Chest:  healed median sternotomy scar, Velcro crackles both bases Cardiac:  regular rhythm, normal S1 and S2, No S3 or S4, no  murmurs, gallops or rubs detected. Abdomen:  abdomen soft,non-tender, no masses, no hepatospenomegaly, or aneurysm noted Peripheral Pulses:  the femoral,dorsalis pedis, and posterior tibial pulses are full and equal bilaterally with no bruits auscultated. Extremities & Back:  well healed saphenous vein donor site RLE, 1+ edema Neurological:  no gross motor or sensory deficits noted, affect appropriate, oriented x3. ____________________________ MOST RECENT LIPID PANEL 02/13/13  CHOL TOTL 60 mg/dl, LDL 21 calc, HDL 31 mg/dl and TRIGLYCER 69 mg/dl ____________________________ IMPRESSIONS/PLAN  1. Peripheral edema that has improved but associated with low albumin and one would wonder about nephrotic syndrome 2. Significant basilar crackles and probable interstitial fibrosis is noted by CT scan 3. Coronary artery disease with previous bypass grafting and stenting 4. Metastatic prostate cancer 5. Diabetes with complications  Recommendations:  He is very complex. I asked him to have a 24-hour urine as well as a BNP. I would like for him to have a chest x-ray and an echocardiogram to evaluate his left ventricular function. He looks much more frail than he did last year. ____________________________ TODAYS ORDERS  1. 2D, color flow, doppler: First Available  2. CHEST XRAY: Today  3. BNP: Today  4. 24 hour urine for protein: today  5. 12 Lead EKG: Today                       ____________________________ Cardiology Physician:  Darden Palmer. MD  FACC

## 2013-05-12 ENCOUNTER — Other Ambulatory Visit: Payer: Self-pay | Admitting: Oncology

## 2013-06-03 ENCOUNTER — Other Ambulatory Visit: Payer: Self-pay | Admitting: Oncology

## 2013-06-12 ENCOUNTER — Telehealth: Payer: Self-pay | Admitting: Oncology

## 2013-06-12 ENCOUNTER — Ambulatory Visit (HOSPITAL_BASED_OUTPATIENT_CLINIC_OR_DEPARTMENT_OTHER): Payer: Medicare Other | Admitting: Oncology

## 2013-06-12 ENCOUNTER — Other Ambulatory Visit (HOSPITAL_BASED_OUTPATIENT_CLINIC_OR_DEPARTMENT_OTHER): Payer: Medicare Other | Admitting: Lab

## 2013-06-12 VITALS — BP 124/54 | HR 72 | Temp 97.5°F | Resp 18 | Ht 69.0 in | Wt 159.4 lb

## 2013-06-12 DIAGNOSIS — C61 Malignant neoplasm of prostate: Secondary | ICD-10-CM

## 2013-06-12 LAB — PSA: PSA: 0.97 ng/mL (ref <=4.00)

## 2013-06-12 LAB — CBC WITH DIFFERENTIAL/PLATELET
BASO%: 0.9 % (ref 0.0–2.0)
Basophils Absolute: 0.1 10*3/uL (ref 0.0–0.1)
EOS%: 3.4 % (ref 0.0–7.0)
HCT: 32.3 % — ABNORMAL LOW (ref 38.4–49.9)
HGB: 10.5 g/dL — ABNORMAL LOW (ref 13.0–17.1)
MCH: 31.1 pg (ref 27.2–33.4)
MONO#: 0.6 10*3/uL (ref 0.1–0.9)
NEUT%: 76.7 % — ABNORMAL HIGH (ref 39.0–75.0)
RDW: 14.6 % (ref 11.0–14.6)
WBC: 8.4 10*3/uL (ref 4.0–10.3)
lymph#: 1 10*3/uL (ref 0.9–3.3)

## 2013-06-12 LAB — COMPREHENSIVE METABOLIC PANEL (CC13)
ALT: 17 U/L (ref 0–55)
AST: 20 U/L (ref 5–34)
Albumin: 2.3 g/dL — ABNORMAL LOW (ref 3.5–5.0)
BUN: 12 mg/dL (ref 7.0–26.0)
CO2: 26 mEq/L (ref 22–29)
Calcium: 8.3 mg/dL — ABNORMAL LOW (ref 8.4–10.4)
Chloride: 106 mEq/L (ref 98–109)
Creatinine: 1.1 mg/dL (ref 0.7–1.3)
Potassium: 5 mEq/L (ref 3.5–5.1)

## 2013-06-12 NOTE — Progress Notes (Signed)
Hematology and Oncology Follow Up Visit  Jackson Mccormick [id number] [birth year] 79 y.o. 06/12/2013 4:04 PM  CC: Jackson Mccormick. Jackson Mccormick, M.D.  Jackson Mccormick, M.D.  Jackson Mccormick. [name], M.D.    Principle Diagnosis: This is an 79 year old Asian gentleman with prostate cancer initially diagnosed Gleason score 4 + 5 = 9 in October 2010 with a PSA of 223.  He has advanced bony metastasis.  Prior Therapy:  1. The patient was treated with hormonal deprivation, initially with Jackson Mccormick therapy and subsequently to [employer]  He had an excellent response, PSA dropped from 223 down to 0.08. 2. His PSA started to rise as high as 0.54 in May 2012. 3. Casodex was started around August 2012 through January 2013. Medication was stopped due to a rising PSA.  Current therapy:  1. Trelstar every 3 months at [facility] 2. Rivka Barbara monthly at [facility]  3. Ketoconazole 200 mg BID with prednisone 5 mg daily started in in 1/ 2013  Interim History: Jackson Mccormick presents today for a followup visit. He is doing fair since his last visit. He is not reporting any major problems related to his prostate cancer or ketoconazole. He has not had any pathological fractures. His appetite and performance status are unchanged. He did develop generalized anasarca and his medication was changed by Jackson Mccormick. Norvasc was stopped and Jackson Mccormick has improved since then. He is doing well with Ketoconazole and prednisone without complications. No chest pain, shortness of breath, abdominal pain, nausea, or vomiting. No bleeding noted.    Medications: I have reviewed the patient's current medications. Current Outpatient Prescriptions  Medication Sig Dispense Refill  . amLODipine (NORVASC) 5 MG tablet Take 5 mg by mouth daily.        Marland Kitchen aspirin 81 MG tablet Take 81 mg by mouth daily.        Marland Kitchen atenolol (TENORMIN) 25 MG tablet Take 25 mg by mouth daily.        . Calcium Carbonate-Vitamin D (CALCIUM + D PO) Take 1 tablet by mouth 2  (two) times daily. Cal 600 mg and Vit D 200 IU       . Cholecalciferol (VITAMIN D-3 PO) Take 2,000 Units by mouth daily.        . clopidogrel (PLAVIX) 75 MG tablet Take 75 mg by mouth daily.        Marland Kitchen denosumab (XGEVA) 120 MG/1.7ML SOLN Inject 120 mg into the skin every 30 (thirty) days.      . fluticasone (FLONASE) 50 MCG/ACT nasal spray Place 4 sprays into the nose daily.        . Folic Acid-Vit B6-Vit B12 (FOLGARD) 0.8-10-0.115 MG TABS Take 1 tablet by mouth daily.        . insulin glargine (LANTUS) 100 UNIT/ML injection Inject 17 Units into the skin 2 (two) times daily.       . insulin lispro (HUMALOG) 100 UNIT/ML injection Inject 6-15 Units into the skin 2 (two) times daily with a meal. Before breakfast and dinner.      Marland Kitchen ipratropium (ATROVENT) 0.03 % nasal spray Place 4 sprays into the nose every 12 (twelve) hours.        Marland Kitchen ketoconazole (NIZORAL) 200 MG tablet TAKE 1 TABLET TWICE DAILY  180 tablet  3  . levocetirizine (XYZAL) 5 MG tablet Take 5 mg by mouth every evening.        Marland Kitchen levothyroxine (SYNTHROID, LEVOTHROID) 200 MCG tablet Take 200 mcg by mouth daily.      Marland Kitchen  pantoprazole (PROTONIX) 40 MG tablet Take 40 mg by mouth daily.        . predniSONE (DELTASONE) 5 MG tablet TAKE 1 TABLET DAILY  90 tablet  3  . rosuvastatin (CRESTOR) 10 MG tablet Take 10 mg by mouth daily.        . simethicone (MYLICON) 125 MG chewable tablet Chew 125 mg by mouth every 6 (six) hours as needed.      Marland Kitchen spironolactone (ALDACTONE) 25 MG tablet Take 25 mg by mouth daily. Take 1/2 tablet by mouth with other am medications once a day      . traMADol-acetaminophen (ULTRACET) 37.5-325 MG per tablet Take 1 tablet by mouth 3 (three) times daily.       . Triptorelin Pamoate (TRELSTAR DEPOT IM) Inject into the muscle every 3 (three) months.        . Trospium Chloride (SANCTURA XR PO) Take 60 mg by mouth daily.        . valsartan-hydrochlorothiazide (DIOVAN-HCT) 320-25 MG per tablet Take 1 tablet by mouth daily.        .  vitamin C (ASCORBIC ACID) 500 MG tablet Take 500 mg by mouth daily.         No current facility-administered medications for this visit.    Allergies:  Allergies  Allergen Reactions  . Morphine And Related Other (See Comments)    Red streaks over my body     Past Medical History, Surgical history, Social history, and Family History were reviewed and updated.  Review of Systems: Constitutional:  Negative for fever, chills, night sweats, anorexia, weight loss, pain. Cardiovascular: no chest pain or dyspnea on exertion Respiratory: no cough, shortness of breath, or wheezing Neurological: no TIA or stroke symptoms Dermatological: negative ENT: negative Skin: Negative. Gastrointestinal: no abdominal pain, change in bowel habits, or black or bloody stools Genito-Urinary: no dysuria, trouble voiding, or hematuria Hematological and Lymphatic: negative Breast: negative Musculoskeletal: negative Remaining ROS negative.  Physical Exam: Blood pressure 124/54, pulse 72, temperature 97.5 F (36.4 C), temperature source Oral, resp. rate 18, height 5\' 9"  (1.753 m), weight 159 lb 6.4 oz (72.303 kg), SpO2 99.00%. ECOG: 1 General appearance: alert Head: Normocephalic, without obvious abnormality, atraumatic Neck: no adenopathy, no carotid bruit, no JVD, supple, symmetrical, trachea midline and thyroid not enlarged, symmetric, no tenderness/mass/nodules Lymph nodes: Cervical, supraclavicular, and axillary nodes normal. Heart:regular rate and rhythm, S1, S2 normal, no murmur, click, rub or gallop Lung:chest clear, no wheezing, rales, normal symmetric air entry. Dry rales noted at the bases.  Abdomen: soft, non-tender, without masses or organomegaly EXT:no erythema, induration, or nodules.  Trace Mccormick noted bilaterally.    Lab Results: Lab Results  Component Value Date   WBC 8.4 06/12/2013   HGB 10.5* 06/12/2013   HCT 32.3* 06/12/2013   MCV 95.2 06/12/2013   PLT 241 06/12/2013      Chemistry      Component Value Date/Time   NA 136 06/12/2013 1502   NA 139 07/11/2012 1424   K 5.0 06/12/2013 1502   K 5.0 07/11/2012 1424   CL 110* 04/15/2013 1459   CL 107 07/11/2012 1424   CO2 26 06/12/2013 1502   CO2 24 07/11/2012 1424   BUN 12.0 06/12/2013 1502   BUN 17 07/11/2012 1424   CREATININE 1.1 06/12/2013 1502   CREATININE 1.17 07/11/2012 1424      Component Value Date/Time   CALCIUM 8.3* 06/12/2013 1502   CALCIUM 8.7 07/11/2012 1424   ALKPHOS 63 06/12/2013 1502  ALKPHOS 43 07/11/2012 1424   AST 20 06/12/2013 1502   AST 18 07/11/2012 1424   ALT 17 06/12/2013 1502   ALT 14 07/11/2012 1424   BILITOT 0.26 06/12/2013 1502   BILITOT 0.4 07/11/2012 1424       Results for BEAUDEN, TREMONT (MRN [id number]) as of 06/12/2013 16:18  Ref. Range 10/25/2012 15:14 12/25/2012 15:03 04/15/2013 14:59  PSA Latest Range: <=4.00 ng/mL 1.47 0.75 1.11     Impression and Plan:  This is a pleasant 79 year old gentleman with the following issues:  1. 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  He is on Ketoconazole as second line hormonal manipulations since 12/2011. He tolerated it well with out complications. PSA  Is pending from today but it has declined from 4.21 to 1.11. If PSA continue to respond, then will continue the current medication and schedule. If PSA rises again, then will consider a different agent such as Zytiga.  2. Bony disease. Continue Xgeva on a monthly basis.This is given at [facility]. 3. Hormonal deprivation: He is to continue on Trelstar. This is given at [facility]. 4. Hyperkalemia: Likely due to Aldactone. This have resolved.  5. Anasarca: It is much improved at this time, but likely Prednisone is contributing. I asked him to take Prednisone 5 mg every other day in attempt to help relive his Mccormick.  6. Follow-up. In 5 weeks.  [facility] 6/26/20144:04 PM

## 2013-06-12 NOTE — Telephone Encounter (Signed)
gv and printed appt sched and avs for pt  °

## 2013-06-12 NOTE — Addendum Note (Signed)
Addended by: Kallie Locks on: 06/12/2013 04:46 PM   Modules accepted: Orders

## 2013-07-23 ENCOUNTER — Telehealth: Payer: Self-pay | Admitting: Oncology

## 2013-07-23 ENCOUNTER — Other Ambulatory Visit (HOSPITAL_BASED_OUTPATIENT_CLINIC_OR_DEPARTMENT_OTHER): Payer: Medicare Other | Admitting: Lab

## 2013-07-23 ENCOUNTER — Ambulatory Visit (HOSPITAL_BASED_OUTPATIENT_CLINIC_OR_DEPARTMENT_OTHER): Payer: Medicare Other | Admitting: Oncology

## 2013-07-23 VITALS — BP 115/53 | HR 75 | Temp 98.5°F | Resp 20 | Ht 69.0 in | Wt 160.3 lb

## 2013-07-23 DIAGNOSIS — R609 Edema, unspecified: Secondary | ICD-10-CM

## 2013-07-23 DIAGNOSIS — C61 Malignant neoplasm of prostate: Secondary | ICD-10-CM

## 2013-07-23 DIAGNOSIS — C7952 Secondary malignant neoplasm of bone marrow: Secondary | ICD-10-CM

## 2013-07-23 LAB — CBC WITH DIFFERENTIAL/PLATELET
BASO%: 0.9 % (ref 0.0–2.0)
EOS%: 8.4 % — ABNORMAL HIGH (ref 0.0–7.0)
MCH: 31.5 pg (ref 27.2–33.4)
MCV: 95.4 fL (ref 79.3–98.0)
MONO%: 7.3 % (ref 0.0–14.0)
RBC: 3.42 10*6/uL — ABNORMAL LOW (ref 4.20–5.82)
RDW: 13.5 % (ref 11.0–14.6)
lymph#: 1.1 10*3/uL (ref 0.9–3.3)

## 2013-07-23 LAB — COMPREHENSIVE METABOLIC PANEL (CC13)
ALT: 19 U/L (ref 0–55)
AST: 29 U/L (ref 5–34)
Albumin: 2.4 g/dL — ABNORMAL LOW (ref 3.5–5.0)
Alkaline Phosphatase: 77 U/L (ref 40–150)
Chloride: 113 mEq/L — ABNORMAL HIGH (ref 98–109)
Potassium: 4.3 mEq/L (ref 3.5–5.1)
Sodium: 141 mEq/L (ref 136–145)
Total Protein: 5.4 g/dL — ABNORMAL LOW (ref 6.4–8.3)

## 2013-07-23 NOTE — Telephone Encounter (Signed)
gv pt appt schedule for October.  °

## 2013-07-23 NOTE — Progress Notes (Signed)
Hematology and Oncology Follow Up Visit  ABUBAKAR CRISPO [id number] [birth year] 79 y.o. 07/23/2013 3:21 PM  CC: Excell Seltzer. [name], M.D.  [name], M.D.  [name]. [name], M.D.    Principle Diagnosis: This is an 79 year old Asian gentleman with prostate cancer initially diagnosed Gleason score 4 + 5 = 9 in October 2010 with a PSA of 223.  He has advanced bony metastasis.  Prior Therapy:  1. The patient was treated with hormonal deprivation, initially with Deborra Medina therapy and subsequently to [employer]  He had an excellent response, PSA dropped from 223 down to 0.08. 2. His PSA started to rise as high as 0.54 in May 2012. 3. Casodex was started around August 2012 through January 2013. Medication was stopped due to a rising PSA.  Current therapy:  1. Trelstar every 3 months at [facility] 2. Rivka Barbara monthly at [facility]  3. Ketoconazole 200 mg BID with prednisone 5 mg every other day. He started this in 1/ 2013  Interim History: Mr. Roughton presents today for a followup visit. He is doing fair since his last visit. He is not reporting any major problems related to his prostate cancer or ketoconazole. He has not had any pathological fractures. His appetite and performance status are unchanged. His  generalized anasarca has improved since Dr. Leslie Dales has changed Norvasc. He is doing well with Ketoconazole and prednisone every other day without complications. No chest pain, shortness of breath, abdominal pain, nausea, or vomiting. No bleeding noted.    Medications: I have reviewed the patient's current medications. Current Outpatient Prescriptions  Medication Sig Dispense Refill  . aspirin 81 MG tablet Take 81 mg by mouth daily.        Marland Kitchen atenolol (TENORMIN) 25 MG tablet Take 25 mg by mouth daily.        . Calcium Carbonate-Vitamin D (CALCIUM + D PO) Take 1 tablet by mouth 3 (three) times daily. Cal 600 mg and Vit D 200 IU      . Cholecalciferol (VITAMIN D-3 PO) Take 1,000 Units  by mouth daily.       . clopidogrel (PLAVIX) 75 MG tablet Take 75 mg by mouth daily.        Marland Kitchen denosumab (XGEVA) 120 MG/1.7ML SOLN Inject 120 mg into the skin every 30 (thirty) days.      . fluticasone (FLONASE) 50 MCG/ACT nasal spray Place 3 sprays into the nose daily.       . Folic Acid-Vit B6-Vit B12 (FOLGARD) 0.8-10-0.115 MG TABS Take 1 tablet by mouth daily.        . insulin glargine (LANTUS) 100 UNIT/ML injection Inject 11 Units into the skin daily.       . insulin lispro (HUMALOG) 100 UNIT/ML injection Inject 1-6 Units into the skin 2 (two) times daily with a meal. Before breakfast and dinner.      Marland Kitchen ipratropium (ATROVENT) 0.03 % nasal spray Place 4 sprays into the nose every 12 (twelve) hours.        Marland Kitchen ketoconazole (NIZORAL) 200 MG tablet TAKE 1 TABLET TWICE DAILY  180 tablet  3  . levocetirizine (XYZAL) 5 MG tablet Take 5 mg by mouth every evening.        Marland Kitchen levothyroxine (SYNTHROID, LEVOTHROID) 200 MCG tablet Take 112 mcg by mouth. Take 2 tablets daily      . oxyCODONE-acetaminophen (PERCOCET) 7.5-325 MG per tablet Take 1 tablet by mouth. Take 1 tablet 2 times daily      . pantoprazole (  PROTONIX) 40 MG tablet Take 40 mg by mouth daily.        . predniSONE (DELTASONE) 5 MG tablet Take 5 mg by mouth every other day.      . rosuvastatin (CRESTOR) 10 MG tablet Take 10 mg by mouth. Take once weekly      . simethicone (MYLICON) 125 MG chewable tablet Chew 125 mg by mouth every 6 (six) hours as needed.      Marland Kitchen spironolactone (ALDACTONE) 25 MG tablet Take 25 mg by mouth daily. Take 1/2 tablet by mouth with other am medications once a day      . Triptorelin Pamoate (TRELSTAR DEPOT IM) Inject into the muscle every 3 (three) months.        . Trospium Chloride (SANCTURA XR PO) Take 60 mg by mouth daily.        . valsartan-hydrochlorothiazide (DIOVAN-HCT) 320-25 MG per tablet Take 1 tablet by mouth daily.        . vitamin C (ASCORBIC ACID) 500 MG tablet Take 500 mg by mouth daily.         No current  facility-administered medications for this visit.    Allergies:  Allergies  Allergen Reactions  . Morphine And Related Other (See Comments)    Red streaks over my body     Past Medical History, Surgical history, Social history, and Family History were reviewed and updated.  Review of Systems: Constitutional:  Negative for fever, chills, night sweats, anorexia, weight loss, pain. Cardiovascular: no chest pain or dyspnea on exertion Respiratory: no cough, shortness of breath, or wheezing Neurological: no TIA or stroke symptoms Dermatological: negative ENT: negative Skin: Negative. Gastrointestinal: no abdominal pain, change in bowel habits, or black or bloody stools Genito-Urinary: no dysuria, trouble voiding, or hematuria Hematological and Lymphatic: negative Breast: negative Musculoskeletal: negative Remaining ROS negative.  Physical Exam: Blood pressure 115/53, pulse 75, temperature 98.5 F (36.9 C), temperature source Oral, resp. rate 20, height 5\' 9"  (1.753 m), weight 160 lb 4.8 oz (72.712 kg). ECOG: 1 General appearance: alert Head: Normocephalic, without obvious abnormality, atraumatic Neck: no adenopathy, no carotid bruit, no JVD, supple, symmetrical, trachea midline and thyroid not enlarged, symmetric, no tenderness/mass/nodules Lymph nodes: Cervical, supraclavicular, and axillary nodes normal. Heart:regular rate and rhythm, S1, S2 normal, no murmur, click, rub or gallop Lung:chest clear, no wheezing, rales, normal symmetric air entry. Dry rales noted at the bases.  Abdomen: soft, non-tender, without masses or organomegaly EXT:no erythema, induration, or nodules.  Trace edema noted bilaterally.    Lab Results: Lab Results  Component Value Date   WBC 9.3 07/23/2013   HGB 10.8* 07/23/2013   HCT 32.7* 07/23/2013   MCV 95.4 07/23/2013   PLT 254 07/23/2013     Chemistry      Component Value Date/Time   NA 136 06/12/2013 1502   NA 139 07/11/2012 1424   K 5.0 06/12/2013  1502   K 5.0 07/11/2012 1424   CL 110* 04/15/2013 1459   CL 107 07/11/2012 1424   CO2 26 06/12/2013 1502   CO2 24 07/11/2012 1424   BUN 12.0 06/12/2013 1502   BUN 17 07/11/2012 1424   CREATININE 1.1 06/12/2013 1502   CREATININE 1.17 07/11/2012 1424      Component Value Date/Time   CALCIUM 8.3* 06/12/2013 1502   CALCIUM 8.7 07/11/2012 1424   ALKPHOS 63 06/12/2013 1502   ALKPHOS 43 07/11/2012 1424   AST 20 06/12/2013 1502   AST 18 07/11/2012 1424   ALT 17  06/12/2013 1502   ALT 14 07/11/2012 1424   BILITOT 0.26 06/12/2013 1502   BILITOT 0.4 07/11/2012 1424       Results for CLARION, MOONEYHAN (MRN [id number]) as of 07/23/2013 15:24  Ref. Range 04/15/2013 14:59 06/12/2013 15:02  PSA Latest Range: <=4.00 ng/mL 1.11 0.97      Impression and Plan:  This is a pleasant 79 year old gentleman with the following issues:  1. 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  He is on Ketoconazole as second line hormonal manipulations since 12/2011. He tolerated it well with out complications. PSA  Is pending from today but it has declined from 4.21 to 0.97. If PSA continue to respond, then will continue the current medication and schedule. If PSA rises again, then will consider a different agent such as Zytiga.  2. Bony disease. Continue Xgeva on a monthly basis.This is given at [facility]. 3. Hormonal deprivation: He is to continue on Trelstar. This is given at [facility]. 4. Hyperkalemia: Likely due to Aldactone. This have resolved.  5. Anasarca: It is much improved at this time, We will continue Prednisone at 5 mg every other day.  6. Follow-up. In 8 weeks.  [facility] 8/6/20143:21 PM

## 2013-07-25 ENCOUNTER — Telehealth: Payer: Self-pay | Admitting: *Deleted

## 2013-07-25 NOTE — Telephone Encounter (Signed)
Called patient with results of PSA drawn on 07/23/13

## 2013-07-25 NOTE — Telephone Encounter (Signed)
Message copied by Reesa Chew on Fri Jul 25, 2013  9:53 AM ------      Message from: Benjiman Core      Created: Thu Jul 24, 2013  8:20 AM       Please call his PSA/ ------

## 2013-07-28 ENCOUNTER — Telehealth: Payer: Self-pay | Admitting: Oncology

## 2013-07-28 NOTE — Telephone Encounter (Signed)
pt called and r/s lab and Md appt from 10/7 to 10/14

## 2013-09-23 ENCOUNTER — Ambulatory Visit: Payer: Medicare Other | Admitting: Oncology

## 2013-09-23 ENCOUNTER — Other Ambulatory Visit: Payer: Medicare Other | Admitting: Lab

## 2013-09-30 ENCOUNTER — Telehealth: Payer: Self-pay | Admitting: Oncology

## 2013-09-30 ENCOUNTER — Other Ambulatory Visit (HOSPITAL_BASED_OUTPATIENT_CLINIC_OR_DEPARTMENT_OTHER): Payer: Medicare Other | Admitting: Lab

## 2013-09-30 ENCOUNTER — Ambulatory Visit (HOSPITAL_BASED_OUTPATIENT_CLINIC_OR_DEPARTMENT_OTHER): Payer: Medicare Other | Admitting: Oncology

## 2013-09-30 VITALS — BP 136/63 | HR 67 | Temp 98.2°F | Resp 20 | Ht 69.0 in | Wt 157.1 lb

## 2013-09-30 DIAGNOSIS — C7951 Secondary malignant neoplasm of bone: Secondary | ICD-10-CM

## 2013-09-30 DIAGNOSIS — C61 Malignant neoplasm of prostate: Secondary | ICD-10-CM

## 2013-09-30 DIAGNOSIS — E876 Hypokalemia: Secondary | ICD-10-CM

## 2013-09-30 LAB — CBC WITH DIFFERENTIAL/PLATELET
EOS%: 1.2 % (ref 0.0–7.0)
Eosinophils Absolute: 0.1 10*3/uL (ref 0.0–0.5)
LYMPH%: 8.7 % — ABNORMAL LOW (ref 14.0–49.0)
MCH: 31.1 pg (ref 27.2–33.4)
MCV: 95 fL (ref 79.3–98.0)
MONO%: 2.4 % (ref 0.0–14.0)
NEUT#: 6.7 10*3/uL — ABNORMAL HIGH (ref 1.5–6.5)
Platelets: 238 10*3/uL (ref 140–400)
RBC: 3.37 10*6/uL — ABNORMAL LOW (ref 4.20–5.82)

## 2013-09-30 LAB — COMPREHENSIVE METABOLIC PANEL (CC13)
Alkaline Phosphatase: 83 U/L (ref 40–150)
BUN: 12.3 mg/dL (ref 7.0–26.0)
Glucose: 87 mg/dl (ref 70–140)
Sodium: 137 mEq/L (ref 136–145)
Total Bilirubin: 0.39 mg/dL (ref 0.20–1.20)
Total Protein: 5.5 g/dL — ABNORMAL LOW (ref 6.4–8.3)

## 2013-09-30 LAB — PSA: PSA: 0.55 ng/mL (ref <=4.00)

## 2013-09-30 NOTE — Telephone Encounter (Signed)
gv adn printed appt sched and avs for pt for Dec °

## 2013-09-30 NOTE — Progress Notes (Signed)
Hematology and Oncology Follow Up Visit  DIARRA KOS [id number] 1928/10/12 79 y.o. 09/30/2013 3:33 PM  CC: Excell Seltzer. [name], M.D.  [name], M.D.  [name]. [name], M.D.    Principle Diagnosis: This is an 79 year old gentleman with prostate cancer initially diagnosed Gleason score 4 + 5 = 9 in October 2010 with a PSA of 223.  He has advanced bony metastasis.  Prior Therapy:  1. The patient was treated with hormonal deprivation, initially with Deborra Medina therapy and subsequently to [employer]  He had an excellent response, PSA dropped from 223 down to 0.08. 2. His PSA started to rise as high as 0.54 in May 2012. 3. Casodex was started around August 2012 through January 2013. Medication was stopped due to a rising PSA.  Current therapy:  1. Trelstar every 3 months at [facility] 2. Rivka Barbara monthly at [facility]  3. Ketoconazole 200 mg BID with prednisone 5 mg every other day. He started this in 1/ 2013  Interim History: Mr. Ellery presents today for a followup visit. He reports doing about the same since his last visit. He is not reporting any major problems related to his prostate cancer or ketoconazole. He has not had any pathological fractures. His appetite and performance status are unchanged. His  generalized anasarca has improved s He is doing well with Ketoconazole and prednisone every other day without complications. No chest pain, shortness of breath, abdominal pain, nausea, or vomiting. No bleeding noted. He does report chronic back pain which has not changed dramatically and seems to continue to be well managed. He is able to ambulate without any difficulties and reports no falls.   Medications: I have reviewed the patient's current medications. Current Outpatient Prescriptions  Medication Sig Dispense Refill  . aspirin 81 MG tablet Take 81 mg by mouth daily.        Marland Kitchen atenolol (TENORMIN) 25 MG tablet Take 25 mg by mouth daily.        . Calcium Carbonate-Vitamin  D (CALCIUM + D PO) Take 1 tablet by mouth 3 (three) times daily. Cal 600 mg and Vit D 200 IU      . Cholecalciferol (VITAMIN D-3 PO) Take 1,000 Units by mouth daily.       . clopidogrel (PLAVIX) 75 MG tablet Take 75 mg by mouth daily.        Marland Kitchen denosumab (XGEVA) 120 MG/1.7ML SOLN Inject 120 mg into the skin every 30 (thirty) days.      . fluticasone (FLONASE) 50 MCG/ACT nasal spray Place 3 sprays into the nose daily.       . Folic Acid-Vit B6-Vit B12 (FOLGARD) 0.8-10-0.115 MG TABS Take 1 tablet by mouth daily.        . insulin glargine (LANTUS) 100 UNIT/ML injection Inject 11 Units into the skin daily.       . insulin lispro (HUMALOG) 100 UNIT/ML injection Inject 1-6 Units into the skin 2 (two) times daily with a meal. Before breakfast and dinner.      Marland Kitchen ipratropium (ATROVENT) 0.03 % nasal spray Place 4 sprays into the nose every 12 (twelve) hours.        Marland Kitchen ketoconazole (NIZORAL) 200 MG tablet TAKE 1 TABLET TWICE DAILY  180 tablet  3  . levocetirizine (XYZAL) 5 MG tablet Take 5 mg by mouth every evening.        Marland Kitchen levothyroxine (SYNTHROID, LEVOTHROID) 200 MCG tablet Take 112 mcg by mouth. Take 2 tablets daily      .  oxyCODONE-acetaminophen (PERCOCET) 7.5-325 MG per tablet Take 1 tablet by mouth. Take 1 tablet 2 times daily      . pantoprazole (PROTONIX) 40 MG tablet Take 40 mg by mouth daily.        . predniSONE (DELTASONE) 5 MG tablet Take 5 mg by mouth every other day.      . rosuvastatin (CRESTOR) 10 MG tablet Take 10 mg by mouth. Take once weekly      . simethicone (MYLICON) 125 MG chewable tablet Chew 125 mg by mouth every 6 (six) hours as needed.      Marland Kitchen spironolactone (ALDACTONE) 25 MG tablet Take 25 mg by mouth daily. Take 1/2 tablet by mouth with other am medications once a day      . Triptorelin Pamoate (TRELSTAR DEPOT IM) Inject into the muscle every 3 (three) months.        . Trospium Chloride (SANCTURA XR PO) Take 60 mg by mouth daily.        . valsartan-hydrochlorothiazide (DIOVAN-HCT)  320-25 MG per tablet Take 1 tablet by mouth daily.        . vitamin C (ASCORBIC ACID) 500 MG tablet Take 500 mg by mouth daily.         No current facility-administered medications for this visit.    Allergies:  Allergies  Allergen Reactions  . Morphine And Related Other (See Comments)    Red streaks over my body     Past Medical History, Surgical history, Social history, and Family History were reviewed and updated.  Review of Systems:  Remaining ROS negative.  Physical Exam: Blood pressure 136/63, pulse 67, temperature 98.2 F (36.8 C), temperature source Oral, resp. rate 20, height 5\' 9"  (1.753 m), weight 157 lb 1.6 oz (71.26 kg). ECOG: 1 General appearance: alert Head: Normocephalic, without obvious abnormality, atraumatic Neck: no adenopathy, no carotid bruit, no JVD, supple, symmetrical, trachea midline and thyroid not enlarged, symmetric, no tenderness/mass/nodules Lymph nodes: Cervical, supraclavicular, and axillary nodes normal. Heart:regular rate and rhythm, S1, S2 normal, no murmur, click, rub or gallop Lung:chest clear, no wheezing, rales, normal symmetric air entry. Dry rales noted at the bases.  Abdomen: soft, non-tender, without masses or organomegaly EXT:no erythema, induration, or nodules.  Trace edema noted bilaterally.    Lab Results: Lab Results  Component Value Date   WBC 7.7 09/30/2013   HGB 10.5* 09/30/2013   HCT 32.0* 09/30/2013   MCV 95.0 09/30/2013   PLT 238 09/30/2013     Chemistry      Component Value Date/Time   NA 141 07/23/2013 1456   NA 139 07/11/2012 1424   K 4.3 07/23/2013 1456   K 5.0 07/11/2012 1424   CL 110* 04/15/2013 1459   CL 107 07/11/2012 1424   CO2 23 07/23/2013 1456   CO2 24 07/11/2012 1424   BUN 14.4 07/23/2013 1456   BUN 17 07/11/2012 1424   CREATININE 1.0 07/23/2013 1456   CREATININE 1.17 07/11/2012 1424      Component Value Date/Time   CALCIUM 8.1* 07/23/2013 1456   CALCIUM 8.7 07/11/2012 1424   ALKPHOS 77 07/23/2013 1456    ALKPHOS 43 07/11/2012 1424   AST 29 07/23/2013 1456   AST 18 07/11/2012 1424   ALT 19 07/23/2013 1456   ALT 14 07/11/2012 1424   BILITOT 0.32 07/23/2013 1456   BILITOT 0.4 07/11/2012 1424        Results for MAKSIM, PEREGOY (MRN [id number]) as of 09/30/2013 15:23  Ref. Range 06/12/2013 15:02  07/23/2013 14:56  PSA Latest Range: <=4.00 ng/mL 0.97 0.50      Impression and Plan:  This is a pleasant 79 year old gentleman with the following issues:  1. 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  He is on Ketoconazole as second line hormonal manipulations since 12/2011. He tolerated it well with out complications. PSA  Is pending from today but it has declined from 4.21 to 0.5 over the last year and a half. If PSA continue to respond, then will continue the current medication and schedule. If PSA rises again, then will consider a different agent such as Zytiga.  2. Bony disease. Continue Xgeva on a monthly basis.This is given at [facility]. 3. Hormonal deprivation: He is to continue on Trelstar. This is given at [facility]. 4. Hyperkalemia: Likely due to Aldactone. This have resolved.  5. Anasarca: It is much improved at this time, We will continue Prednisone at 5 mg every other day.  6. Follow-up. In 8 weeks.  Bilbo Carcamo 10/14/20143:33 PM

## 2013-09-30 NOTE — Progress Notes (Signed)
Updated medication list mailed to patient.

## 2013-10-01 ENCOUNTER — Telehealth: Payer: Self-pay | Admitting: *Deleted

## 2013-10-01 NOTE — Telephone Encounter (Signed)
Gave patient results of PSA

## 2013-10-01 NOTE — Telephone Encounter (Signed)
Message copied by Reesa Chew on Wed Oct 01, 2013  4:58 PM ------      Message from: Jackson Mccormick      Created: Wed Oct 01, 2013  9:24 AM       Please call his PSA. Stable. ------

## 2013-11-10 ENCOUNTER — Telehealth: Payer: Self-pay | Admitting: Oncology

## 2013-11-10 NOTE — Telephone Encounter (Signed)
returned pt call and r/s appt to 12.5.14...done

## 2013-11-18 ENCOUNTER — Other Ambulatory Visit: Payer: Medicare Other | Admitting: Lab

## 2013-11-18 ENCOUNTER — Ambulatory Visit: Payer: Medicare Other | Admitting: Oncology

## 2013-11-21 ENCOUNTER — Telehealth: Payer: Self-pay | Admitting: Oncology

## 2013-11-21 ENCOUNTER — Ambulatory Visit (HOSPITAL_BASED_OUTPATIENT_CLINIC_OR_DEPARTMENT_OTHER): Payer: Medicare Other | Admitting: Oncology

## 2013-11-21 ENCOUNTER — Other Ambulatory Visit (HOSPITAL_BASED_OUTPATIENT_CLINIC_OR_DEPARTMENT_OTHER): Payer: Medicare Other | Admitting: Lab

## 2013-11-21 VITALS — BP 136/61 | HR 66 | Temp 97.5°F | Resp 18 | Ht 69.0 in | Wt 158.9 lb

## 2013-11-21 DIAGNOSIS — R609 Edema, unspecified: Secondary | ICD-10-CM

## 2013-11-21 DIAGNOSIS — C61 Malignant neoplasm of prostate: Secondary | ICD-10-CM

## 2013-11-21 DIAGNOSIS — C7951 Secondary malignant neoplasm of bone: Secondary | ICD-10-CM

## 2013-11-21 DIAGNOSIS — E291 Testicular hypofunction: Secondary | ICD-10-CM

## 2013-11-21 LAB — COMPREHENSIVE METABOLIC PANEL (CC13)
Anion Gap: 8 mEq/L (ref 3–11)
BUN: 12.5 mg/dL (ref 7.0–26.0)
CO2: 27 mEq/L (ref 22–29)
Calcium: 8.5 mg/dL (ref 8.4–10.4)
Chloride: 107 mEq/L (ref 98–109)
Creatinine: 0.9 mg/dL (ref 0.7–1.3)

## 2013-11-21 LAB — CBC WITH DIFFERENTIAL/PLATELET
Basophils Absolute: 0.1 10*3/uL (ref 0.0–0.1)
HCT: 31.6 % — ABNORMAL LOW (ref 38.4–49.9)
HGB: 10.2 g/dL — ABNORMAL LOW (ref 13.0–17.1)
MONO#: 0.7 10*3/uL (ref 0.1–0.9)
NEUT%: 67 % (ref 39.0–75.0)
WBC: 8 10*3/uL (ref 4.0–10.3)
lymph#: 1.1 10*3/uL (ref 0.9–3.3)

## 2013-11-21 NOTE — Telephone Encounter (Signed)
gv and printed appt sched and avs for pt for Jan 2015 °

## 2013-11-21 NOTE — Progress Notes (Signed)
Hematology and Oncology Follow Up Visit  ROEMELLO SPEYER [id number] 1928-08-25 79 y.o. 11/21/2013 3:45 PM  CC: Excell Seltzer. [name], M.D.  [name], M.D.  [name]. [name], M.D.    Principle Diagnosis: This is an 79 year old gentleman with prostate cancer initially diagnosed Gleason score 4 + 5 = 9 in October 2010 with a PSA of 223.  He has advanced bony metastasis.  Prior Therapy:  1. The patient was treated with hormonal deprivation, initially with Deborra Medina therapy and subsequently to [employer]  He had an excellent response, PSA dropped from 223 down to 0.08. 2. His PSA started to rise as high as 0.54 in May 2012. 3. Casodex was started around August 2012 through January 2013. Medication was stopped due to a rising PSA.  Current therapy:  1. Trelstar every 3 months at [facility] 2. Rivka Barbara monthly at [facility]  3. Ketoconazole 200 mg BID with prednisone 5 mg every other day. He started this in 1/ 2013.   Interim History: Mr. Nickerson presents today for a followup visit. He reports doing about the same since his last visit. He is not reporting any major problems related to his prostate cancer or ketoconazole. He has not had any pathological fractures. His appetite and performance status are unchanged. His generalized anasarca has improved. He is doing well with Ketoconazole and prednisone every other day without complications. No chest pain, shortness of breath, abdominal pain, nausea, or vomiting. No bleeding noted. He does report chronic back pain which has not changed dramatically and seems to continue to be well managed. He is able to ambulate without any difficulties and reports no falls. He has not reported any fevers chills or sweats. Has not reported any recent hospitalizations or illnesses.   Medications: I have reviewed the patient's current medications. Current Outpatient Prescriptions  Medication Sig Dispense Refill  . aspirin 81 MG tablet Take 81 mg by mouth  daily.        Marland Kitchen atenolol (TENORMIN) 25 MG tablet Take 25 mg by mouth daily.        . Calcium Carbonate-Vitamin D (CALCIUM + D PO) Take 1 tablet by mouth 3 (three) times daily. Cal 600 mg and Vit D 200 IU      . Cholecalciferol (VITAMIN D-3 PO) Take 1,000 Units by mouth daily.       . clopidogrel (PLAVIX) 75 MG tablet Take 75 mg by mouth daily.        Marland Kitchen denosumab (XGEVA) 120 MG/1.7ML SOLN Inject 120 mg into the skin every 30 (thirty) days.      . diclofenac (FLECTOR) 1.3 % PTCH Place 1 patch onto the skin 2 (two) times daily. As needed      . fluticasone (FLONASE) 50 MCG/ACT nasal spray Place 3 sprays into the nose daily.       . Folic Acid-Vit B6-Vit B12 (FOLGARD) 0.8-10-0.115 MG TABS Take 1 tablet by mouth daily.        . hydroxypropyl methylcellulose (ISOPTO TEARS) 2.5 % ophthalmic solution Place 1 drop into both eyes daily.      . insulin glargine (LANTUS) 100 UNIT/ML injection Inject 11 Units into the skin daily.       . insulin lispro (HUMALOG) 100 UNIT/ML injection Inject 1-6 Units into the skin 2 (two) times daily with a meal. Before breakfast and dinner.      Marland Kitchen ipratropium (ATROVENT) 0.03 % nasal spray Place 4 sprays into the nose every 12 (twelve) hours.        Marland Kitchen  ketoconazole (NIZORAL) 200 MG tablet TAKE 1 TABLET TWICE DAILY  180 tablet  3  . levocetirizine (XYZAL) 5 MG tablet Take 5 mg by mouth every evening.        Marland Kitchen levothyroxine (SYNTHROID, LEVOTHROID) 200 MCG tablet Take 112 mcg by mouth. Take 2 tablets daily      . NON FORMULARY Apply 1 application topically daily. As needed for back pain      . oxyCODONE-acetaminophen (PERCOCET) 7.5-325 MG per tablet Take 1 tablet by mouth. Take 1 tablet 2 times daily      . pantoprazole (PROTONIX) 40 MG tablet Take 40 mg by mouth daily.        . predniSONE (DELTASONE) 5 MG tablet Take 5 mg by mouth every other day.      . rosuvastatin (CRESTOR) 10 MG tablet Take 10 mg by mouth. Take once weekly      . simethicone (MYLICON) 125 MG chewable tablet  Chew 125 mg by mouth every 6 (six) hours as needed.      Marland Kitchen spironolactone (ALDACTONE) 25 MG tablet Take 25 mg by mouth daily. Take 1/2 tablet by mouth with other am medications once a day      . tetrahydrozoline 0.05 % ophthalmic solution Place 2 drops into both eyes daily.      . Triptorelin Pamoate (TRELSTAR DEPOT IM) Inject into the muscle every 3 (three) months.        . Trospium Chloride (SANCTURA XR PO) Take 60 mg by mouth daily.        . valsartan-hydrochlorothiazide (DIOVAN-HCT) 320-25 MG per tablet Take 1 tablet by mouth daily.        . vitamin C (ASCORBIC ACID) 500 MG tablet Take 500 mg by mouth daily.         No current facility-administered medications for this visit.    Allergies:  Allergies  Allergen Reactions  . Morphine And Related Other (See Comments)    Red streaks over my body     Past Medical History, Surgical history, Social history, and Family History were reviewed and updated.  Review of Systems:  Remaining ROS negative.  Physical Exam: Blood pressure 136/61, pulse 66, temperature 97.5 F (36.4 C), temperature source Oral, resp. rate 18, height 5\' 9"  (1.753 m), weight 158 lb 14.4 oz (72.077 kg), SpO2 100.00%. ECOG: 1 General appearance: alert Head: Normocephalic, without obvious abnormality, atraumatic Neck: no adenopathy, no carotid bruit, no JVD, supple, symmetrical, trachea midline and thyroid not enlarged, symmetric, no tenderness/mass/nodules Lymph nodes: Cervical, supraclavicular, and axillary nodes normal. Heart:regular rate and rhythm, S1, S2 normal, no murmur, click, rub or gallop Lung:chest clear, no wheezing, rales, normal symmetric air entry. Dry rales noted at the bases.  Abdomen: soft, non-tender, without masses or organomegaly EXT:no erythema, induration, or nodules.  Trace edema noted bilaterally.    Lab Results: Lab Results  Component Value Date   WBC 8.0 11/21/2013   HGB 10.2* 11/21/2013   HCT 31.6* 11/21/2013   MCV 96.4 11/21/2013    PLT 216 11/21/2013     Chemistry      Component Value Date/Time   NA 137 09/30/2013 1457   NA 139 07/11/2012 1424   K 4.6 09/30/2013 1457   K 5.0 07/11/2012 1424   CL 110* 04/15/2013 1459   CL 107 07/11/2012 1424   CO2 25 09/30/2013 1457   CO2 24 07/11/2012 1424   BUN 12.3 09/30/2013 1457   BUN 17 07/11/2012 1424   CREATININE 0.9 09/30/2013 1457  CREATININE 1.17 07/11/2012 1424      Component Value Date/Time   CALCIUM 8.1* 09/30/2013 1457   CALCIUM 8.7 07/11/2012 1424   ALKPHOS 83 09/30/2013 1457   ALKPHOS 43 07/11/2012 1424   AST 33 09/30/2013 1457   AST 18 07/11/2012 1424   ALT 21 09/30/2013 1457   ALT 14 07/11/2012 1424   BILITOT 0.39 09/30/2013 1457   BILITOT 0.4 07/11/2012 1424      Results for DARIK, MASSING (MRN [id number]) as of 11/21/2013 15:35  Ref. Range 06/12/2013 15:02 07/23/2013 14:56 09/30/2013 14:58  PSA Latest Range: <=4.00 ng/mL 0.97 0.50 0.55    Impression and Plan:  This is a pleasant 79 year old gentleman with the following issues:  1. 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  He is on Ketoconazole as second line hormonal manipulations since 12/2011. He tolerated it well with out complications. PSA  Is pending from today but it has declined from 4.21 to 0.55 over the last year and a half. If PSA continue to respond, then will continue the current medication and schedule. If PSA rises again, then will consider a different agent such as Zytiga.  2. Bony disease. Continue Xgeva on a monthly basis.This is given at [facility]. 3. Hormonal deprivation: He is to continue on Trelstar. This is given at [facility]. 4. Hyperkalemia: Likely due to Aldactone. This have resolved.  5. Anasarca: It is much improved at this time, we will continue Prednisone at 5 mg every other day.  6. Follow-up. In 8 weeks.  Keimari Allerton 12/5/20143:45 PM

## 2013-11-25 ENCOUNTER — Telehealth: Payer: Self-pay | Admitting: *Deleted

## 2013-11-25 NOTE — Telephone Encounter (Signed)
Spoke with patient's wife, gave results of last PSA. 

## 2013-11-25 NOTE — Telephone Encounter (Signed)
Message copied by Reesa Chew on Tue Nov 25, 2013  9:02 AM ------      Message from: Benjiman Core      Created: Tue Nov 25, 2013  8:23 AM       Please call his PSA. ------

## 2014-01-16 ENCOUNTER — Other Ambulatory Visit (HOSPITAL_BASED_OUTPATIENT_CLINIC_OR_DEPARTMENT_OTHER): Payer: Medicare Other

## 2014-01-16 ENCOUNTER — Telehealth: Payer: Self-pay | Admitting: Oncology

## 2014-01-16 ENCOUNTER — Ambulatory Visit (HOSPITAL_BASED_OUTPATIENT_CLINIC_OR_DEPARTMENT_OTHER): Payer: Medicare Other | Admitting: Oncology

## 2014-01-16 VITALS — BP 116/62 | HR 79 | Temp 98.2°F | Resp 18 | Ht 69.0 in | Wt 156.9 lb

## 2014-01-16 DIAGNOSIS — C61 Malignant neoplasm of prostate: Secondary | ICD-10-CM

## 2014-01-16 DIAGNOSIS — E291 Testicular hypofunction: Secondary | ICD-10-CM

## 2014-01-16 DIAGNOSIS — R609 Edema, unspecified: Secondary | ICD-10-CM

## 2014-01-16 LAB — COMPREHENSIVE METABOLIC PANEL (CC13)
ALT: 23 U/L (ref 0–55)
ANION GAP: 6 meq/L (ref 3–11)
AST: 31 U/L (ref 5–34)
Albumin: 2.1 g/dL — ABNORMAL LOW (ref 3.5–5.0)
Alkaline Phosphatase: 100 U/L (ref 40–150)
BILIRUBIN TOTAL: 0.51 mg/dL (ref 0.20–1.20)
BUN: 18.3 mg/dL (ref 7.0–26.0)
CALCIUM: 7.9 mg/dL — AB (ref 8.4–10.4)
CHLORIDE: 110 meq/L — AB (ref 98–109)
CO2: 24 meq/L (ref 22–29)
CREATININE: 1 mg/dL (ref 0.7–1.3)
Glucose: 136 mg/dl (ref 70–140)
Potassium: 5.3 mEq/L — ABNORMAL HIGH (ref 3.5–5.1)
Sodium: 140 mEq/L (ref 136–145)
Total Protein: 4.9 g/dL — ABNORMAL LOW (ref 6.4–8.3)

## 2014-01-16 LAB — CBC WITH DIFFERENTIAL/PLATELET
BASO%: 0.8 % (ref 0.0–2.0)
BASOS ABS: 0.1 10*3/uL (ref 0.0–0.1)
EOS%: 4.7 % (ref 0.0–7.0)
Eosinophils Absolute: 0.5 10*3/uL (ref 0.0–0.5)
HEMATOCRIT: 30.7 % — AB (ref 38.4–49.9)
HEMOGLOBIN: 9.9 g/dL — AB (ref 13.0–17.1)
LYMPH#: 0.8 10*3/uL — AB (ref 0.9–3.3)
LYMPH%: 8.3 % — AB (ref 14.0–49.0)
MCH: 31.1 pg (ref 27.2–33.4)
MCHC: 32.2 g/dL (ref 32.0–36.0)
MCV: 96.5 fL (ref 79.3–98.0)
MONO#: 0.9 10*3/uL (ref 0.1–0.9)
MONO%: 9.7 % (ref 0.0–14.0)
NEUT#: 7.4 10*3/uL — ABNORMAL HIGH (ref 1.5–6.5)
NEUT%: 76.5 % — AB (ref 39.0–75.0)
PLATELETS: 257 10*3/uL (ref 140–400)
RBC: 3.18 10*6/uL — ABNORMAL LOW (ref 4.20–5.82)
RDW: 13.7 % (ref 11.0–14.6)
WBC: 9.7 10*3/uL (ref 4.0–10.3)

## 2014-01-16 NOTE — Telephone Encounter (Signed)
gv adn printed appt sched and avs for pt for March.... °

## 2014-01-16 NOTE — Progress Notes (Signed)
Hematology and Oncology Follow Up Visit  SAMANYU TINNELL [id number] [birth year] 80 y.o. 01/16/2014 3:44 PM  CC: Marshall Cork. Jeffie Pollock, M.D.  Ezzard Standing, M.D.  Juanda Bond. [name], M.D.    Principle Diagnosis: This is an 80 year old gentleman with prostate cancer initially diagnosed Gleason score 4 + 5 = 9 in October 2010 with a PSA of 223.  He has advanced bony metastasis.  Prior Therapy:  1. The patient was treated with hormonal deprivation, initially with Mills Koller therapy and subsequently to [employer].  He had an excellent response, PSA dropped from 223 down to 0.08. 2. His PSA started to rise as high as 0.54 in May 2012. 3. Casodex was started around August 2012 through January 2013. Medication was stopped due to a rising PSA.  Current therapy:  1. Trelstar every 3 months at [facility] 2. Delton See monthly at [facility]  3. Ketoconazole 200 mg BID with prednisone 5 mg every other day. He started this in 1/ 2013.   Interim History: Mr. Champlain presents today for a followup visit. He reports doing about the same since his last visit. He is not reporting any major problems related to his prostate cancer or ketoconazole. He has not had any pathological fractures. His appetite and performance status are unchanged. His generalized anasarca has improved. He is doing well with Ketoconazole and prednisone every other day without complications. No chest pain, shortness of breath, abdominal pain, nausea, or vomiting. No bleeding noted. He does report chronic back pain which has not changed dramatically and seems to continue to be well managed. He is able to ambulate without any difficulties and reports no falls. He has not reported any fevers chills or sweats. Has not reported any recent hospitalizations or illnesses. He continues to have a reasonable performance status at this time without any major changes.   Medications: I have reviewed the patient's current medications. Current Outpatient  Prescriptions  Medication Sig Dispense Refill  . aspirin 81 MG tablet Take 81 mg by mouth daily.        Marland Kitchen atenolol (TENORMIN) 25 MG tablet Take 25 mg by mouth daily.        . Calcium Carbonate-Vitamin D (CALCIUM + D PO) Take 1 tablet by mouth 3 (three) times daily. Cal 600 mg and Vit D 200 IU      . Cholecalciferol (VITAMIN D-3 PO) Take 1,000 Units by mouth daily.       . clopidogrel (PLAVIX) 75 MG tablet Take 75 mg by mouth daily.        Marland Kitchen denosumab (XGEVA) 120 MG/1.7ML SOLN Inject 120 mg into the skin every 30 (thirty) days.      . diclofenac (FLECTOR) 1.3 % PTCH Place 1 patch onto the skin 2 (two) times daily. As needed      . fluticasone (FLONASE) 50 MCG/ACT nasal spray Place 3 sprays into the nose daily.       . Folic Acid-Vit F5-DDU K02 (FOLGARD) 0.8-10-0.115 MG TABS Take 1 tablet by mouth daily.        . hydroxypropyl methylcellulose (ISOPTO TEARS) 2.5 % ophthalmic solution Place 1 drop into both eyes daily.      . insulin glargine (LANTUS) 100 UNIT/ML injection Inject 11 Units into the skin daily.       . insulin lispro (HUMALOG) 100 UNIT/ML injection Inject 1-6 Units into the skin 2 (two) times daily with a meal. Before breakfast and dinner.      Marland Kitchen ipratropium (ATROVENT) 0.03 % nasal  spray Place 4 sprays into the nose every 12 (twelve) hours.        Marland Kitchen ketoconazole (NIZORAL) 200 MG tablet TAKE 1 TABLET TWICE DAILY  180 tablet  3  . levocetirizine (XYZAL) 5 MG tablet Take 5 mg by mouth every evening.        Marland Kitchen levothyroxine (SYNTHROID, LEVOTHROID) 200 MCG tablet Take 112 mcg by mouth. Take 2 tablets daily      . NON FORMULARY Apply 1 application topically daily. As needed for back pain      . oxyCODONE-acetaminophen (PERCOCET) 7.5-325 MG per tablet Take 1 tablet by mouth. Take 1 tablet 2 times daily      . pantoprazole (PROTONIX) 40 MG tablet Take 40 mg by mouth daily.        . predniSONE (DELTASONE) 5 MG tablet Take 5 mg by mouth every other day.      . rosuvastatin (CRESTOR) 10 MG  tablet Take 10 mg by mouth. Take once weekly      . simethicone (MYLICON) 0000000 MG chewable tablet Chew 125 mg by mouth every 6 (six) hours as needed.      Marland Kitchen spironolactone (ALDACTONE) 25 MG tablet Take 25 mg by mouth daily. Take 1/2 tablet by mouth with other am medications once a day      . tetrahydrozoline 0.05 % ophthalmic solution Place 2 drops into both eyes daily.      . Triptorelin Pamoate (TRELSTAR DEPOT IM) Inject into the muscle every 3 (three) months.        . Trospium Chloride (SANCTURA XR PO) Take 60 mg by mouth daily.        . valsartan-hydrochlorothiazide (DIOVAN-HCT) 320-25 MG per tablet Take 1 tablet by mouth daily.        . vitamin C (ASCORBIC ACID) 500 MG tablet Take 500 mg by mouth daily.         No current facility-administered medications for this visit.    Allergies:  Allergies  Allergen Reactions  . Morphine And Related Other (See Comments)    Red streaks over my body     Past Medical History, Surgical history, Social history, and Family History were reviewed and updated.  Review of Systems:  Remaining ROS negative.  Physical Exam: Blood pressure 116/62, pulse 79, temperature 98.2 F (36.8 C), temperature source Oral, resp. rate 18, height 5\' 9"  (1.753 m), weight 156 lb 14.4 oz (71.169 kg). ECOG: 1 General appearance: alert Head: Normocephalic, without obvious abnormality, atraumatic Neck: no adenopathy, no carotid bruit, no JVD, supple, symmetrical, trachea midline and thyroid not enlarged, symmetric, no tenderness/mass/nodules Lymph nodes: Cervical, supraclavicular, and axillary nodes normal. Heart:regular rate and rhythm, S1, S2 normal, no murmur, click, rub or gallop Lung:chest clear, no wheezing, rales, normal symmetric air entry. Dry rales noted at the bases.  Abdomen: soft, non-tender, without masses or organomegaly EXT:no erythema, induration, or nodules.  Trace edema noted bilaterally.    Lab Results: Lab Results  Component Value Date   WBC  9.7 01/16/2014   HGB 9.9* 01/16/2014   HCT 30.7* 01/16/2014   MCV 96.5 01/16/2014   PLT 257 01/16/2014     Chemistry      Component Value Date/Time   NA 141 11/21/2013 1507   NA 139 07/11/2012 1424   K 4.1 11/21/2013 1507   K 5.0 07/11/2012 1424   CL 110* 04/15/2013 1459   CL 107 07/11/2012 1424   CO2 27 11/21/2013 1507   CO2 24 07/11/2012 1424  BUN 12.5 11/21/2013 1507   BUN 17 07/11/2012 1424   CREATININE 0.9 11/21/2013 1507   CREATININE 1.17 07/11/2012 1424      Component Value Date/Time   CALCIUM 8.5 11/21/2013 1507   CALCIUM 8.7 07/11/2012 1424   ALKPHOS 84 11/21/2013 1507   ALKPHOS 43 07/11/2012 1424   AST 44* 11/21/2013 1507   AST 18 07/11/2012 1424   ALT 31 11/21/2013 1507   ALT 14 07/11/2012 1424   BILITOT 0.35 11/21/2013 1507   BILITOT 0.4 07/11/2012 1424     Results for MARQUE, RADEMAKER (MRN [id number]) as of 01/16/2014 15:32  Ref. Range 07/23/2013 14:56 09/30/2013 14:58 11/21/2013 15:07  PSA Latest Range: <=4.00 ng/mL 0.50 0.55 0.51      Impression and Plan:  This is a pleasant 80 year old gentleman with the following issues:  1. 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  He is on Ketoconazole as second line hormonal manipulations since 12/2011. He tolerated it well with out complications. PSA  Is pending from today but it has declined from 4.21 to 0.51 over the last year and a half. If PSA continue to respond, then will continue the current medication and schedule. If PSA rises again, then will consider a different agent such as Zytiga.  2. Bony disease. Continue Xgeva on a monthly basis.This is given at [facility]. 3. Hormonal deprivation: He is to continue on Trelstar. This is given at [facility]. 4. Hyperkalemia: Likely due to Aldactone. This have resolved.  5. Anasarca: It is much improved at this time, we will continue Prednisone at 5 mg every other day.  6. Follow-up. In 8 weeks.  ZTIWPY,KDXIP 1/30/20153:44 PM

## 2014-01-17 LAB — PSA: PSA: 0.55 ng/mL (ref <=4.00)

## 2014-01-19 ENCOUNTER — Telehealth: Payer: Self-pay | Admitting: *Deleted

## 2014-01-19 NOTE — Telephone Encounter (Signed)
Called patient with latest PSA result.

## 2014-01-19 NOTE — Telephone Encounter (Signed)
Message copied by Randolm Idol on Mon Jan 19, 2014 10:03 AM ------      Message from: Wyatt Portela      Created: Mon Jan 19, 2014  8:58 AM       Please call his PSA. Stable. ------

## 2014-03-11 ENCOUNTER — Other Ambulatory Visit: Payer: Self-pay | Admitting: Urology

## 2014-03-11 ENCOUNTER — Ambulatory Visit (HOSPITAL_COMMUNITY)
Admission: RE | Admit: 2014-03-11 | Discharge: 2014-03-11 | Disposition: A | Discharge status: Home / Self Care | Payer: Medicare Other | Source: Ambulatory Visit | Attending: Urology | Admitting: Urology

## 2014-03-11 ENCOUNTER — Other Ambulatory Visit: Payer: Self-pay | Admitting: Oncology

## 2014-03-11 DIAGNOSIS — Z862 Personal history of diseases of the blood and blood-forming organs and certain disorders involving the immune mechanism: Secondary | ICD-10-CM | POA: Insufficient documentation

## 2014-03-11 DIAGNOSIS — Z8679 Personal history of other diseases of the circulatory system: Secondary | ICD-10-CM | POA: Insufficient documentation

## 2014-03-11 DIAGNOSIS — Z8639 Personal history of other endocrine, nutritional and metabolic disease: Secondary | ICD-10-CM | POA: Insufficient documentation

## 2014-03-11 DIAGNOSIS — R0602 Shortness of breath: Secondary | ICD-10-CM

## 2014-03-11 DIAGNOSIS — R918 Other nonspecific abnormal finding of lung field: Secondary | ICD-10-CM | POA: Insufficient documentation

## 2014-03-11 DIAGNOSIS — Z8546 Personal history of malignant neoplasm of prostate: Secondary | ICD-10-CM | POA: Insufficient documentation

## 2014-03-11 DIAGNOSIS — Z87891 Personal history of nicotine dependence: Secondary | ICD-10-CM | POA: Insufficient documentation

## 2014-03-11 DIAGNOSIS — Z951 Presence of aortocoronary bypass graft: Secondary | ICD-10-CM | POA: Insufficient documentation

## 2014-03-13 ENCOUNTER — Other Ambulatory Visit (HOSPITAL_BASED_OUTPATIENT_CLINIC_OR_DEPARTMENT_OTHER): Payer: Medicare Other

## 2014-03-13 ENCOUNTER — Ambulatory Visit (HOSPITAL_BASED_OUTPATIENT_CLINIC_OR_DEPARTMENT_OTHER): Payer: Medicare Other | Admitting: Oncology

## 2014-03-13 ENCOUNTER — Encounter: Payer: Self-pay | Admitting: Oncology

## 2014-03-13 ENCOUNTER — Telehealth: Payer: Self-pay | Admitting: Oncology

## 2014-03-13 VITALS — BP 107/65 | HR 90 | Temp 97.8°F | Resp 18 | Ht 69.0 in | Wt 138.8 lb

## 2014-03-13 DIAGNOSIS — R634 Abnormal weight loss: Secondary | ICD-10-CM

## 2014-03-13 DIAGNOSIS — J8410 Pulmonary fibrosis, unspecified: Secondary | ICD-10-CM

## 2014-03-13 DIAGNOSIS — C61 Malignant neoplasm of prostate: Secondary | ICD-10-CM

## 2014-03-13 DIAGNOSIS — R609 Edema, unspecified: Secondary | ICD-10-CM

## 2014-03-13 DIAGNOSIS — E291 Testicular hypofunction: Secondary | ICD-10-CM

## 2014-03-13 DIAGNOSIS — C7951 Secondary malignant neoplasm of bone: Secondary | ICD-10-CM

## 2014-03-13 DIAGNOSIS — C7952 Secondary malignant neoplasm of bone marrow: Secondary | ICD-10-CM

## 2014-03-13 LAB — COMPREHENSIVE METABOLIC PANEL (CC13)
ALBUMIN: 2.3 g/dL — AB (ref 3.5–5.0)
ALK PHOS: 103 U/L (ref 40–150)
ALT: 26 U/L (ref 0–55)
ANION GAP: 5 meq/L (ref 3–11)
AST: 29 U/L (ref 5–34)
BUN: 17.4 mg/dL (ref 7.0–26.0)
CALCIUM: 8.5 mg/dL (ref 8.4–10.4)
CHLORIDE: 107 meq/L (ref 98–109)
CO2: 25 meq/L (ref 22–29)
Creatinine: 1 mg/dL (ref 0.7–1.3)
GLUCOSE: 262 mg/dL — AB (ref 70–140)
POTASSIUM: 5.4 meq/L — AB (ref 3.5–5.1)
SODIUM: 137 meq/L (ref 136–145)
TOTAL PROTEIN: 5.2 g/dL — AB (ref 6.4–8.3)
Total Bilirubin: 0.35 mg/dL (ref 0.20–1.20)

## 2014-03-13 LAB — CBC WITH DIFFERENTIAL/PLATELET
BASO%: 1.3 % (ref 0.0–2.0)
BASOS ABS: 0.1 10*3/uL (ref 0.0–0.1)
EOS ABS: 2.1 10*3/uL — AB (ref 0.0–0.5)
EOS%: 21.3 % — ABNORMAL HIGH (ref 0.0–7.0)
HCT: 34.3 % — ABNORMAL LOW (ref 38.4–49.9)
HEMOGLOBIN: 10.9 g/dL — AB (ref 13.0–17.1)
LYMPH#: 1 10*3/uL (ref 0.9–3.3)
LYMPH%: 10.6 % — ABNORMAL LOW (ref 14.0–49.0)
MCH: 30.8 pg (ref 27.2–33.4)
MCHC: 31.9 g/dL — ABNORMAL LOW (ref 32.0–36.0)
MCV: 96.7 fL (ref 79.3–98.0)
MONO#: 0.7 10*3/uL (ref 0.1–0.9)
MONO%: 7.1 % (ref 0.0–14.0)
NEUT%: 59.7 % (ref 39.0–75.0)
NEUTROS ABS: 5.8 10*3/uL (ref 1.5–6.5)
Platelets: 212 10*3/uL (ref 140–400)
RBC: 3.55 10*6/uL — AB (ref 4.20–5.82)
RDW: 13.9 % (ref 11.0–14.6)
WBC: 9.7 10*3/uL (ref 4.0–10.3)

## 2014-03-13 NOTE — Progress Notes (Signed)
Hematology and Oncology Follow Up Visit  Jackson Mccormick [id number] [birth year] 80 y.o. 03/13/2014 3:55 PM  CC: Marshall Cork. Jeffie Pollock, M.D.  Ezzard Standing, M.D.  Juanda Bond. [name], M.D.    Principle Diagnosis: This is an 80 year old gentleman with prostate cancer initially diagnosed Gleason score 4 + 5 = 9 in October 2010 with a PSA of 223.  He has advanced bony metastasis.  Prior Therapy:  1. The patient was treated with hormonal deprivation, initially with Mills Koller therapy and subsequently to [employer].  He had an excellent response, PSA dropped from 223 down to 0.08. 2. His PSA started to rise as high as 0.54 in May 2012. 3. Casodex was started around August 2012 through January 2013. Medication was stopped due to a rising PSA.  Current therapy:  1. Trelstar every 3 months at [facility] 2. Delton See monthly at [facility]  3. Ketoconazole 200 mg BID with prednisone 5 mg every other day. He started this in 1/ 2013.   Interim History: Jackson Mccormick presents today for a followup visit. Since his last visit, he has reported few issues that have contributed to a slight decline. He reported an episode of dental infection and a root canal as well as a urinary tract infection. 2 days ago he also also evaluated by Dr. Jeffie Pollock and developed acute shortness of breath. A chest x-ray was obtained and suggested the finding of coronary fibrosis. He reports doing better today from a breathing standpoint and he finished all have his antibiotic courses. He is not reporting any major problems related to his prostate cancer or ketoconazole. He has not had any pathological fractures. His appetite has been down and have lost significant amount of weight. His generalized anasarca has improved. He is doing well with Ketoconazole and prednisone every other day without complications. No chest pain, abdominal pain, nausea, or vomiting. No bleeding noted. He does report chronic back pain which has not changed dramatically  and seems to continue to be well managed. He is able to ambulate without any difficulties and reports no falls. He has not reported any fevers chills or sweats. Has not reported any recent hospitalizations or illnesses.    Medications: I have reviewed the patient's current medications. Current Outpatient Prescriptions  Medication Sig Dispense Refill  . aspirin 81 MG tablet Take 81 mg by mouth daily.        Marland Kitchen atenolol (TENORMIN) 25 MG tablet Take 25 mg by mouth daily.        . Calcium Carbonate-Vitamin D (CALCIUM + D PO) Take 1 tablet by mouth 3 (three) times daily. Cal 600 mg and Vit D 200 IU      . Cholecalciferol (VITAMIN D-3 PO) Take 2,000 Units by mouth daily.       . clopidogrel (PLAVIX) 75 MG tablet Take 75 mg by mouth daily.        Marland Kitchen denosumab (XGEVA) 120 MG/1.7ML SOLN Inject 120 mg into the skin every 30 (thirty) days.      . diclofenac (FLECTOR) 1.3 % PTCH Place 1 patch onto the skin 2 (two) times daily. As needed      . fluticasone (FLONASE) 50 MCG/ACT nasal spray Place 3 sprays into the nose daily.       . Folic Acid-Vit B7-SEG B15 (FOLGARD) 0.8-10-0.115 MG TABS Take 1 tablet by mouth daily.        . hydroxypropyl methylcellulose (ISOPTO TEARS) 2.5 % ophthalmic solution Place 1 drop into both eyes daily.      Marland Kitchen  insulin glargine (LANTUS) 100 UNIT/ML injection Inject 11 Units into the skin daily.       . insulin lispro (HUMALOG) 100 UNIT/ML injection Inject 1-6 Units into the skin 2 (two) times daily with a meal. Before breakfast and dinner.      Marland Kitchen ipratropium (ATROVENT) 0.03 % nasal spray Place 4 sprays into the nose every 12 (twelve) hours.        Marland Kitchen ketoconazole (NIZORAL) 200 MG tablet TAKE 1 TABLET TWICE DAILY  180 tablet  3  . levocetirizine (XYZAL) 5 MG tablet Take 5 mg by mouth every evening.        Marland Kitchen levothyroxine (SYNTHROID, LEVOTHROID) 200 MCG tablet Take 112 mcg by mouth. Take 2 tablets daily      . NON FORMULARY Apply 1 application topically daily. As needed for back pain       . oxyCODONE-acetaminophen (PERCOCET) 7.5-325 MG per tablet Take 1 tablet by mouth. Take 1 tablet 2 times daily      . pantoprazole (PROTONIX) 40 MG tablet Take 40 mg by mouth daily.        . predniSONE (DELTASONE) 5 MG tablet TAKE 1 TABLET EVERY DAY  90 tablet  3  . rosuvastatin (CRESTOR) 10 MG tablet Take 10 mg by mouth. Takes 3 times weekly      . simethicone (MYLICON) 892 MG chewable tablet Chew 125 mg by mouth 3 (three) times daily.       Marland Kitchen spironolactone (ALDACTONE) 25 MG tablet Take 25 mg by mouth daily. Take 1/2 tablet by mouth with other am medications once a day      . tetrahydrozoline 0.05 % ophthalmic solution Place 2 drops into both eyes daily.      . Triptorelin Pamoate (TRELSTAR DEPOT IM) Inject into the muscle every 3 (three) months.        . Trospium Chloride (SANCTURA XR PO) Take 60 mg by mouth at bedtime.       . valsartan-hydrochlorothiazide (DIOVAN-HCT) 320-25 MG per tablet Take 1 tablet by mouth daily.        . vitamin C (ASCORBIC ACID) 500 MG tablet Take 500 mg by mouth daily.         No current facility-administered medications for this visit.    Allergies:  Allergies  Allergen Reactions  . Morphine And Related Other (See Comments)    Red streaks over my body     Past Medical History, Surgical history, Social history, and Family History were reviewed and updated.  Review of Systems:  Remaining ROS negative.  Physical Exam: Blood pressure 107/65, pulse 90, temperature 97.8 F (36.6 C), temperature source Oral, resp. rate 18, height 5\' 9"  (1.753 m), weight 138 lb 12.8 oz (62.959 kg). ECOG: 1 General appearance: alert awake appeared in no distress. Chronically ill-appearing however. Head: Normocephalic, without obvious abnormality, atraumatic Neck: no adenopathy, no carotid bruit, no JVD, supple, symmetrical, trachea midline and thyroid not enlarged, symmetric, no tenderness/mass/nodules Lymph nodes: Cervical, supraclavicular, and axillary nodes  normal. Heart:regular rate and rhythm, S1, S2 normal, no murmur, click, rub or gallop Lung:chest clear, no wheezing, rales, normal symmetric air entry. Dry rales noted at the bases unchanged on today's exam. Abdomen: soft, non-tender, without masses or organomegaly EXT:no erythema, induration, or nodules.  Trace edema noted bilaterally.    Lab Results: Lab Results  Component Value Date   WBC 9.7 03/13/2014   HGB 10.9* 03/13/2014   HCT 34.3* 03/13/2014   MCV 96.7 03/13/2014   PLT 212 03/13/2014  Chemistry      Component Value Date/Time   NA 140 01/16/2014 1516   NA 139 07/11/2012 1424   K 5.3* 01/16/2014 1516   K 5.0 07/11/2012 1424   CL 110* 04/15/2013 1459   CL 107 07/11/2012 1424   CO2 24 01/16/2014 1516   CO2 24 07/11/2012 1424   BUN 18.3 01/16/2014 1516   BUN 17 07/11/2012 1424   CREATININE 1.0 01/16/2014 1516   CREATININE 1.17 07/11/2012 1424      Component Value Date/Time   CALCIUM 7.9* 01/16/2014 1516   CALCIUM 8.7 07/11/2012 1424   ALKPHOS 100 01/16/2014 1516   ALKPHOS 43 07/11/2012 1424   AST 31 01/16/2014 1516   AST 18 07/11/2012 1424   ALT 23 01/16/2014 1516   ALT 14 07/11/2012 1424   BILITOT 0.51 01/16/2014 1516   BILITOT 0.4 07/11/2012 1424      Results for ARLEN, HAVRANEK (MRN [id number]) as of 03/13/2014 15:29  Ref. Range 11/21/2013 15:07 01/16/2014 15:17  PSA Latest Range: <=4.00 ng/mL 0.51 0.55   EXAM:  CHEST 2 VIEW  COMPARISON: DG CHEST 2 VIEW dated 04/21/2013; DG CHEST 2 VIEW dated  06/09/2011; DG CHEST 2 VIEW dated 05/31/2010; CT ABD - PELV WO/W CM  dated 10/19/2009  FINDINGS:  Grossly unchanged borderline enlarged cardiac silhouette and  mediastinal contours given reduced lung volumes. Post median  sternotomy and CABG. Worsening bilateral mid and lower lung  predominant slightly coarsened reticular opacities, left greater  than right. No pleural effusion. No pneumothorax. No definite  evidence of edema. There is increased sclerosis of multiple  vertebral bodies as  well as the left twelfth rib as demonstrated on  prior examinations and compatible with provided history of prostate  cancer.  IMPRESSION:  1. Worsening bilateral mid and lower lung predominant slightly  coarsened reticular opacities, left greater than right, nonspecific  though worrisome for progressive pulmonary fibrosis, though note, an  underlying acute infectious process is not excluded. Further  evaluation with chest CT may be performed as clinically indicated.  2. Similar appearance of increased sclerosis of multiple vertebral  bodies as well as the left twelfth rib compatible with provided  history of prostate cancer.    Impression and Plan:  This is a pleasant 80 year old gentleman with the following issues:  1. 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  He is on Ketoconazole as second line hormonal manipulations since 12/2011. He tolerated it well with out complications. PSA  Is pending from today but it has declined from 4.21 to 0.55 over the last 2 years. If PSA continue to respond, then will continue the current medication and schedule. If PSA rises again, then will consider a different agent such as Zytiga.  2. Bony disease. Continue Xgeva on a monthly basis.This is given at [facility]. 3. Hormonal deprivation: He is to continue on Trelstar. This is given at [facility]. 4. Shortness of breath and pulmonary fibrosis: Clinically he is about the same at his baseline today and I shared with him the findings of the chest x-ray obtained by Dr. Jeffie Pollock. I offered him a referral to pulmonary medicine and he declined stating that he feels about the same at this point. 5. Anasarca: It is much improved at this time, we will continue Prednisone at 5 mg every other day.  6. Weight loss: This is a more concerning at this point although I do not feel this is related to spread of his cancer. I encouraged him to increase his  intake of dietary supplements.  7. Follow-up. In 8  weeks.  PNTIRW,ERXVQ 3/27/20153:55 PM

## 2014-03-13 NOTE — Telephone Encounter (Signed)
gv adn printed appt sched and avs for pt for May °

## 2014-03-14 LAB — PSA: PSA: 0.73 ng/mL (ref <=4.00)

## 2014-03-16 ENCOUNTER — Telehealth: Payer: Self-pay | Admitting: Medical Oncology

## 2014-03-16 NOTE — Telephone Encounter (Signed)
He needs to talk to his PCP about this. I can't proscribe it for him.

## 2014-03-16 NOTE — Telephone Encounter (Signed)
Patient called, as well as spouse, reporting that patient is "suffering from depression," "feelings of being down" and requesting "something light, that won't interfere with his appetite either." Requesting medication to help with the above. States they had office visit with MD 03/27 and at the time reported he "wasn't having problems, but discussed it this weekend" and feel that he may need something.   Message sent to MD inbox.   Next OV sched for 05/29 lab/MD.

## 2014-03-16 NOTE — Telephone Encounter (Signed)
Per MD, informed spouse patient needs to call his PCP regarding management of depression symptoms and request for medication. Spouse expressed verbal understanding, denies further questions at this time.

## 2014-04-16 ENCOUNTER — Telehealth: Payer: Self-pay | Admitting: Oncology

## 2014-04-16 ENCOUNTER — Telehealth: Payer: Self-pay | Admitting: Medical Oncology

## 2014-04-16 NOTE — Telephone Encounter (Signed)
FS out 5/29. s/w pt re new appt for 6/3. message to desk nurse re calling pt w/last psa results.

## 2014-04-16 NOTE — Telephone Encounter (Signed)
Patient requesting last PSA results. Results given, patient expressed thanks, no further questions at this time.

## 2014-05-15 ENCOUNTER — Other Ambulatory Visit: Payer: Medicare Other

## 2014-05-15 ENCOUNTER — Ambulatory Visit: Payer: Medicare Other | Admitting: Oncology

## 2014-05-20 ENCOUNTER — Encounter: Payer: Self-pay | Admitting: Oncology

## 2014-05-20 ENCOUNTER — Ambulatory Visit (HOSPITAL_BASED_OUTPATIENT_CLINIC_OR_DEPARTMENT_OTHER): Payer: Medicare Other | Admitting: Oncology

## 2014-05-20 ENCOUNTER — Other Ambulatory Visit (HOSPITAL_BASED_OUTPATIENT_CLINIC_OR_DEPARTMENT_OTHER): Payer: Medicare Other

## 2014-05-20 ENCOUNTER — Telehealth: Payer: Self-pay | Admitting: Oncology

## 2014-05-20 VITALS — BP 134/57 | HR 64 | Temp 97.5°F | Resp 18 | Ht 69.0 in | Wt 133.1 lb

## 2014-05-20 DIAGNOSIS — R609 Edema, unspecified: Secondary | ICD-10-CM

## 2014-05-20 DIAGNOSIS — J8410 Pulmonary fibrosis, unspecified: Secondary | ICD-10-CM

## 2014-05-20 DIAGNOSIS — E291 Testicular hypofunction: Secondary | ICD-10-CM

## 2014-05-20 DIAGNOSIS — R634 Abnormal weight loss: Secondary | ICD-10-CM

## 2014-05-20 DIAGNOSIS — C7952 Secondary malignant neoplasm of bone marrow: Secondary | ICD-10-CM

## 2014-05-20 DIAGNOSIS — C61 Malignant neoplasm of prostate: Secondary | ICD-10-CM

## 2014-05-20 DIAGNOSIS — C7951 Secondary malignant neoplasm of bone: Secondary | ICD-10-CM

## 2014-05-20 LAB — COMPREHENSIVE METABOLIC PANEL (CC13)
ALBUMIN: 2.1 g/dL — AB (ref 3.5–5.0)
ALT: 17 U/L (ref 0–55)
AST: 25 U/L (ref 5–34)
Alkaline Phosphatase: 97 U/L (ref 40–150)
Anion Gap: 10 mEq/L (ref 3–11)
BUN: 11.5 mg/dL (ref 7.0–26.0)
CALCIUM: 8.3 mg/dL — AB (ref 8.4–10.4)
CO2: 26 mEq/L (ref 22–29)
Chloride: 96 mEq/L — ABNORMAL LOW (ref 98–109)
Creatinine: 0.9 mg/dL (ref 0.7–1.3)
GLUCOSE: 154 mg/dL — AB (ref 70–140)
POTASSIUM: 5.3 meq/L — AB (ref 3.5–5.1)
Sodium: 132 mEq/L — ABNORMAL LOW (ref 136–145)
Total Bilirubin: 0.57 mg/dL (ref 0.20–1.20)
Total Protein: 5.3 g/dL — ABNORMAL LOW (ref 6.4–8.3)

## 2014-05-20 LAB — CBC WITH DIFFERENTIAL/PLATELET
BASO%: 1 % (ref 0.0–2.0)
Basophils Absolute: 0.1 10*3/uL (ref 0.0–0.1)
EOS%: 4.8 % (ref 0.0–7.0)
Eosinophils Absolute: 0.4 10*3/uL (ref 0.0–0.5)
HEMATOCRIT: 33.3 % — AB (ref 38.4–49.9)
HGB: 10.9 g/dL — ABNORMAL LOW (ref 13.0–17.1)
LYMPH#: 0.8 10*3/uL — AB (ref 0.9–3.3)
LYMPH%: 9.6 % — AB (ref 14.0–49.0)
MCH: 30.3 pg (ref 27.2–33.4)
MCHC: 32.7 g/dL (ref 32.0–36.0)
MCV: 92.5 fL (ref 79.3–98.0)
MONO#: 0.6 10*3/uL (ref 0.1–0.9)
MONO%: 7.3 % (ref 0.0–14.0)
NEUT#: 6.2 10*3/uL (ref 1.5–6.5)
NEUT%: 77.3 % — AB (ref 39.0–75.0)
Platelets: 309 10*3/uL (ref 140–400)
RBC: 3.59 10*6/uL — ABNORMAL LOW (ref 4.20–5.82)
RDW: 13.2 % (ref 11.0–14.6)
WBC: 8 10*3/uL (ref 4.0–10.3)

## 2014-05-20 MED ORDER — MEGESTROL ACETATE 400 MG/10ML PO SUSP: 400.0000 mg | Freq: Three times a day (TID) | ORAL | Status: DC

## 2014-05-20 NOTE — Progress Notes (Signed)
Hematology and Oncology Follow Up Visit  Jackson Mccormick [id number] 26-Jan-1928 80 y.o. 05/20/2014 3:35 PM       Principle Diagnosis: This is an 80 year old gentleman with prostate cancer initially diagnosed Gleason score 4 + 5 = 9 in October 2010 with a PSA of 223.  He has advanced bony metastasis.  Prior Therapy:  1. The patient was treated with hormonal deprivation, initially with [employer]ler therapy and subsequently to [employer].  He had an excellent response, PSA dropped from 223 down to 0.08. 2. His PSA started to rise as high as 0.54 in May 2012. 3. Casodex was started around August 2012 through January 2013. Medication was stopped due to a rising PSA.  Current therapy:  1. Trelstar every 3 months at [facility] 2. Delton See monthly at [facility]  3. Ketoconazole 200 mg BID with prednisone 5 mg every other day. He started this in 1/ 2013 after a rise in PSA to 4.21.   Interim History: Jackson Mccormick presents today for a followup visit with his wife. Since his last visit, he has continues to decline slowly. He continues to have shortness of breath and worsening dyspnea on exertion. It has been sporadic in nature but now is getting more persistent. He reports doing better today from a breathing standpoint but that's not his baseline at this time. He also continues to have decline in his appetite, weight loss and decline in his performance status. He is not reporting any major problems related ketoconazole. He has not had any pathological fractures. His generalized anasarca has resolved now. He is not reporting any headaches or blurry vision or double vision. Did not report any fevers chills or sweats. He reports no chest pain, abdominal pain, nausea, or vomiting. No bleeding noted. He does report chronic back pain which has not changed dramatically and seems to continue to be stable. He is able to ambulate without any difficulties and reports no recent falls. He has not reported any recent  hospitalizations or illnesses and the rest of the review of system is unremarkable.    Medications: I have reviewed the patient's current medications. Current Outpatient Prescriptions  Medication Sig Dispense Refill  . amLODipine (NORVASC) 5 MG tablet Take 5 mg by mouth daily. 1/2 tablet once a day      . aspirin 81 MG tablet Take 81 mg by mouth daily.        Marland Kitchen atenolol (TENORMIN) 25 MG tablet Take 25 mg by mouth daily.        . Calcium Carbonate-Vitamin D (CALCIUM + D PO) Take 1 tablet by mouth 2 times daily at 12 noon and 4 pm. Cal 600 mg and Vit D 200 IU      . Cholecalciferol (VITAMIN D-3 PO) Take 2,000 Units by mouth daily.       . clopidogrel (PLAVIX) 75 MG tablet Take 75 mg by mouth daily.        Marland Kitchen denosumab (XGEVA) 120 MG/1.7ML SOLN Inject 120 mg into the skin every 30 (thirty) days.      . diclofenac (FLECTOR) 1.3 % PTCH Place 1 patch onto the skin 2 (two) times daily. As needed      . fluticasone (FLONASE) 50 MCG/ACT nasal spray Place 2 sprays into the nose daily.       . Folic Acid-Vit H8-EXH B71 (FOLGARD) 0.8-10-0.115 MG TABS Take 1 tablet by mouth daily.        . hydroxypropyl methylcellulose (ISOPTO TEARS) 2.5 % ophthalmic solution Place 1  drop into both eyes daily.      . insulin glargine (LANTUS) 100 UNIT/ML injection Inject 11 Units into the skin daily. Pt reports taking 3 units      . insulin lispro (HUMALOG) 100 UNIT/ML injection Inject 1-6 Units into the skin 2 (two) times daily with a meal. Before breakfast and dinner.      Marland Kitchen ipratropium (ATROVENT) 0.03 % nasal spray Place 4 sprays into the nose every 12 (twelve) hours.        Marland Kitchen ketoconazole (NIZORAL) 200 MG tablet TAKE 2 TABLET TWICE DAILY      . levocetirizine (XYZAL) 5 MG tablet Take 5 mg by mouth every evening.        Marland Kitchen levothyroxine (SYNTHROID, LEVOTHROID) 200 MCG tablet Take 112 mcg by mouth. Take 2 tablets daily, plus reports extra 1/2 tablet 3 times a week on empty stomach once a day.      . oxyCODONE-acetaminophen  (PERCOCET) 7.5-325 MG per tablet Take 1 tablet by mouth every 8 (eight) hours as needed. Take 1 tablet 2 times daily      . pantoprazole (PROTONIX) 40 MG tablet Take 40 mg by mouth daily.        . predniSONE (DELTASONE) 5 MG tablet TAKE 1 TABLET EVERY DAY  90 tablet  3  . simethicone (MYLICON) 376 MG chewable tablet Chew 125 mg by mouth 2 (two) times daily.       Marland Kitchen tetrahydrozoline 0.05 % ophthalmic solution Place 2 drops into both eyes daily.      . Triptorelin Pamoate (TRELSTAR DEPOT IM) Inject into the muscle every 3 (three) months.        . Trospium Chloride (SANCTURA XR PO) Take 60 mg by mouth daily before breakfast.       . valsartan-hydrochlorothiazide (DIOVAN-HCT) 320-25 MG per tablet Take 1 tablet by mouth daily.        . vitamin C (ASCORBIC ACID) 500 MG tablet Take 500 mg by mouth daily.        . megestrol (MEGACE) 400 MG/10ML suspension Take 10 mLs (400 mg total) by mouth 3 (three) times daily.  240 mL  0  . NON FORMULARY Apply 1 application topically daily. As needed for back pain       No current facility-administered medications for this visit.    Allergies:  Allergies  Allergen Reactions  . Morphine And Related Other (See Comments)    Red streaks over my body     Past Medical History, Surgical history, Social history, and Family History were reviewed and updated.    Physical Exam: Blood pressure 134/57, pulse 64, temperature 97.5 F (36.4 C), temperature source Oral, resp. rate 18, height 5\' 9"  (1.753 m), weight 133 lb 1.6 oz (60.374 kg). ECOG: 2 General appearance: alert awake appeared in mild distress. Chronically ill-appearing. Head: Normocephalic, without obvious abnormality Neck: no adenopathy, no masses.  Lymph nodes: Cervical, supraclavicular, and axillary nodes normal. Heart:regular rate and rhythm, S1, S2 normal, no murmur, click, rub or gallop Lung:chest clear, no wheezing, rales, normal symmetric air entry. Dry rales noted at the bases no wheezes or  dullness to percussion. Abdomen: soft, non-tender, without masses or organomegaly EXT:no erythema, induration, or nodules.  Trace edema noted bilaterally.    Lab Results: Lab Results  Component Value Date   WBC 8.0 05/20/2014   HGB 10.9* 05/20/2014   HCT 33.3* 05/20/2014   MCV 92.5 05/20/2014   PLT 309 05/20/2014     Chemistry  Component Value Date/Time   NA 132* 05/20/2014 1442   NA 139 07/11/2012 1424   K 5.3* 05/20/2014 1442   K 5.0 07/11/2012 1424   CL 110* 04/15/2013 1459   CL 107 07/11/2012 1424   CO2 26 05/20/2014 1442   CO2 24 07/11/2012 1424   BUN 11.5 05/20/2014 1442   BUN 17 07/11/2012 1424   CREATININE 0.9 05/20/2014 1442   CREATININE 1.17 07/11/2012 1424      Component Value Date/Time   CALCIUM 8.3* 05/20/2014 1442   CALCIUM 8.7 07/11/2012 1424   ALKPHOS 97 05/20/2014 1442   ALKPHOS 43 07/11/2012 1424   AST 25 05/20/2014 1442   AST 18 07/11/2012 1424   ALT 17 05/20/2014 1442   ALT 14 07/11/2012 1424   BILITOT 0.57 05/20/2014 1442   BILITOT 0.4 07/11/2012 1424      Results for PHARRELL, LEDFORD (MRN [id number]) as of 05/20/2014 14:34  Ref. Range 01/16/2014 15:17 03/13/2014 15:30  PSA Latest Range: <=4.00 ng/mL 0.55 0.73   Impression and Plan:  This is a pleasant 80 year old gentleman with the following issues:  1. 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  He is on Ketoconazole as second line hormonal manipulations since 12/2011. He tolerated it well with out complications. PSA  Is pending from today but it has declined from 4.21 to 0.55 over the last 2 years. His last PSA was slightly up to 0.73. Given his physical decline, failure to thrive and weight loss I will like to stage him with a CT scan chest abdomen and pelvis to see if he have developed visceral metastasis from his prostate cancer or any other malignancy. We'll arrange for that before his next visit based on his request. 2. Bony disease. Continue Xgeva on a monthly basis.This is given at [facility]. 3. Hormonal deprivation:  He is to continue on Trelstar. This is given at [facility]. 4. Shortness of breath and pulmonary fibrosis: Clinically worse today than previously. He have declined Pulmonary referral in the past but he is open to it at this time. I will arrange for that to take place in the near future. 5. Anasarca: It is much improved at this time, we will continue Prednisone at 5 mg every other day.  6. Weight loss: This is a more concerning at this point. I gave him a prescription for Megace to take today. 7. Follow-up. In 6 weeks.  Wyatt Portela 6/3/20153:35 PM

## 2014-05-20 NOTE — Telephone Encounter (Signed)
gv and printed appt sched and avs foro pt for July...gv pt bariumj...pt sched to see Dr. Joya Gaskins on 6.8.15 @ 2pm

## 2014-05-21 LAB — PSA: PSA: 0.75 ng/mL (ref <=4.00)

## 2014-05-21 LAB — TESTOSTERONE: Testosterone: 37 ng/dL — ABNORMAL LOW (ref 300–890)

## 2014-05-22 ENCOUNTER — Telehealth: Payer: Self-pay | Admitting: Medical Oncology

## 2014-05-22 ENCOUNTER — Other Ambulatory Visit: Payer: Self-pay | Admitting: Oncology

## 2014-05-22 NOTE — Telephone Encounter (Signed)
Spouse called informing office that pt's prescription for Megace needs pre authorization from insurance for coverage. Mssg given to Carmelina Noun in [facility].  Also requesting PSA results. Results given. Expressed thanks, no further questions at this time.

## 2014-05-25 ENCOUNTER — Encounter: Payer: Self-pay | Admitting: Critical Care Medicine

## 2014-05-25 ENCOUNTER — Ambulatory Visit (INDEPENDENT_AMBULATORY_CARE_PROVIDER_SITE_OTHER): Payer: Medicare Other | Admitting: Critical Care Medicine

## 2014-05-25 VITALS — BP 120/66 | HR 84 | Ht 68.0 in | Wt 135.6 lb

## 2014-05-25 DIAGNOSIS — J8410 Pulmonary fibrosis, unspecified: Secondary | ICD-10-CM

## 2014-05-25 DIAGNOSIS — R0989 Other specified symptoms and signs involving the circulatory and respiratory systems: Secondary | ICD-10-CM

## 2014-05-25 DIAGNOSIS — I10 Essential (primary) hypertension: Secondary | ICD-10-CM | POA: Insufficient documentation

## 2014-05-25 DIAGNOSIS — R0600 Dyspnea, unspecified: Secondary | ICD-10-CM

## 2014-05-25 DIAGNOSIS — R0609 Other forms of dyspnea: Secondary | ICD-10-CM

## 2014-05-25 DIAGNOSIS — E785 Hyperlipidemia, unspecified: Secondary | ICD-10-CM | POA: Insufficient documentation

## 2014-05-25 DIAGNOSIS — I2510 Atherosclerotic heart disease of native coronary artery without angina pectoris: Secondary | ICD-10-CM | POA: Insufficient documentation

## 2014-05-25 DIAGNOSIS — R0689 Other abnormalities of breathing: Secondary | ICD-10-CM

## 2014-05-25 DIAGNOSIS — E039 Hypothyroidism, unspecified: Secondary | ICD-10-CM | POA: Insufficient documentation

## 2014-05-25 NOTE — Assessment & Plan Note (Signed)
History of progressive exertional dyspnea without significant cough. Chest x-ray shows bibasilar atelectasis and scarring that has begun since the earliest film 1 record of 2004. There does appear to be progression on serial x-rays. No available CT scan of chest is seen. Etiology of this is unclear. The patient now has advanced prostate cancer metastatic to bone under therapy. The dyspnea and pulmonary fibrotic changes do not appear to be related to either the prostate cancer or its treatment. Note on today's visit there is no exertional desaturation Plan An overnight sleep oxygen test will be obtained A lung function study will be obtained A CT of the Chest will be done when you get your abdominal CT scan REcords from DR Irine Seal will be obtained

## 2014-05-25 NOTE — Patient Instructions (Signed)
An overnight sleep oxygen test will be obtained A lung function study will be obtained A CT of the Chest will be done when you get your abdominal CT scan REcords from DR Irine Seal will be obtained, you will sign record release Return after CT Chest obtained

## 2014-05-25 NOTE — Progress Notes (Signed)
Subjective:    Patient ID: Jackson Mccormick, male    DOB: 07/25/1928, 80 y.o.   MRN: [id number]  HPI Comments: Dyspnea for 4 months. Hx of prostate CA. Dx 67yrs ago.  Mets to bones at first.  Pt is dyspneic at rest and exertion, comes and goes.  No real chest pain. No cough.  No DVT hx  Shortness of Breath Pertinent negatives include no abdominal pain, chest pain, claudication, coryza, ear pain, fever, headaches, hemoptysis, leg pain, leg swelling, neck pain, orthopnea, PND, rash, rhinorrhea, sore throat, sputum production, swollen glands, syncope, vomiting or wheezing. The symptoms are aggravated by any activity and exercise. Risk factors include smoking. He has tried nothing for the symptoms. His past medical history is significant for CAD. There is no history of allergies, aspirin allergies, asthma, bronchiolitis, chronic lung disease, COPD, DVT, a heart failure, PE, pneumonia or a recent surgery. (Prior PTCA, CABG, stents)   Record below as per recent oncology visit: Principle Diagnosis: This is an 80 year old gentleman with prostate cancer initially diagnosed Gleason score 4 + 5 = 9 in October 2010 with a PSA of 223. He has advanced bony metastasis.  Prior Therapy:  1. The patient was treated with hormonal deprivation, initially with [facility] therapy and subsequently to [employer] He had an excellent response, PSA dropped from 223 down to 0.08. 2. His PSA started to rise as high as 0.54 in May 2012. 3. Casodex was started around August 2012 through January 2013. Medication was stopped due to a rising PSA. Current therapy:  1. Trelstar every 3 months at [facility]  2. Delton See monthly at [facility]  3. Ketoconazole 200 mg BID with prednisone 5 mg every other day. He started this in 1/ 2013 after a rise in PSA to 4.21.    Pt is seen by Urology Jeffie Pollock for Rx of prostate [facility]  Past Medical History  Diagnosis Date  . Prostate cancer   . Diabetes mellitus   .  Hypertension   . CAD (coronary artery disease)   . Spinal stenosis   . BPH (benign prostatic hyperplasia)   . Hyperlipemia   . Hypothyroidism   . Myocardial infarction   . Peptic ulcer disease     2009     Family History  Problem Relation Age of Onset  . Pancreatic cancer Brother   . Multiple myeloma Mother   . Liver cancer Brother      History   Social History  . Marital Status: Married    Spouse Name: N/A    Number of Children: N/A  . Years of Education: N/A   Occupational History  . Retired     Engineer, materials    Social History Main Topics  . Smoking status: Former Smoker -- 1.50 packs/day for 10 years    Types: Cigarettes    Quit date: 12/18/1966  . Smokeless tobacco: Never Used  . Alcohol Use: 1.2 oz/week    2 Glasses of wine per week  . Drug Use: Not on file  . Sexual Activity: Not on file   Other Topics Concern  . Not on file   Social History Narrative  . No narrative on file     Allergies  Allergen Reactions  . Morphine And Related Other (See Comments)    Red streaks over my body      Outpatient Prescriptions Prior to Visit  Medication Sig Dispense Refill  . amLODipine (NORVASC) 5 MG tablet Take 5 mg  by mouth daily.       Marland Kitchen aspirin 81 MG tablet Take 81 mg by mouth daily.        Marland Kitchen atenolol (TENORMIN) 25 MG tablet Take 25 mg by mouth daily.        . Calcium Carbonate-Vitamin D (CALCIUM + D PO) Take 1 tablet by mouth 2 times daily at 12 noon and 4 pm. Cal 600 mg and Vit D 200 IU      . Cholecalciferol (VITAMIN D-3 PO) Take 2,000 Units by mouth daily.       . clopidogrel (PLAVIX) 75 MG tablet Take 75 mg by mouth daily.        Marland Kitchen denosumab (XGEVA) 120 MG/1.7ML SOLN ON HOLD      . diclofenac (FLECTOR) 1.3 % PTCH Place 1 patch onto the skin 2 (two) times daily. As needed      . fluticasone (FLONASE) 50 MCG/ACT nasal spray Place 2 sprays into the nose daily.       . Folic Acid-Vit D6-UYQ I34 (FOLGARD) 0.8-10-0.115 MG TABS Take 1 tablet by  mouth daily.        . hydroxypropyl methylcellulose (ISOPTO TEARS) 2.5 % ophthalmic solution Place 1 drop into both eyes daily.      . insulin glargine (LANTUS) 100 UNIT/ML injection Inject 3 Units into the skin daily.       . insulin lispro (HUMALOG) 100 UNIT/ML injection Inject 1-4 Units into the skin 2 (two) times daily with a meal. Before breakfast and dinner.      Marland Kitchen ipratropium (ATROVENT) 0.03 % nasal spray Place 4 sprays into the nose daily.       Marland Kitchen ketoconazole (NIZORAL) 200 MG tablet TAKE 1 TABLET TWICE DAILY      . levocetirizine (XYZAL) 5 MG tablet Take 5 mg by mouth every evening.        . megestrol (MEGACE) 400 MG/10ML suspension Take 10 mLs (400 mg total) by mouth 3 (three) times daily.  240 mL  0  . NON FORMULARY Apply 1 application topically daily. As needed for back pain      . oxyCODONE-acetaminophen (PERCOCET) 7.5-325 MG per tablet Take 1 tablet by mouth 3 (three) times daily.       . pantoprazole (PROTONIX) 40 MG tablet Take 40 mg by mouth daily.        . simethicone (MYLICON) 742 MG chewable tablet Chew 125 mg by mouth 2 (two) times daily.       Marland Kitchen tetrahydrozoline 0.05 % ophthalmic solution Place 2 drops into both eyes daily.      . Triptorelin Pamoate (TRELSTAR DEPOT IM) Inject into the muscle every 3 (three) months.        . Trospium Chloride (SANCTURA XR PO) Take 60 mg by mouth daily before breakfast.       . valsartan-hydrochlorothiazide (DIOVAN-HCT) 320-25 MG per tablet Take 1 tablet by mouth daily.        . vitamin C (ASCORBIC ACID) 500 MG tablet Take 500 mg by mouth daily.        . predniSONE (DELTASONE) 5 MG tablet TAKE 1 TABLET EVERY DAY  90 tablet  3  . levothyroxine (SYNTHROID, LEVOTHROID) 200 MCG tablet Take by mouth.        No facility-administered medications prior to visit.      Review of Systems  Constitutional: Positive for fatigue and unexpected weight change. Negative for fever.       Weight loss 40#  HENT: Negative for  ear pain, postnasal drip,  rhinorrhea, sinus pressure, sneezing, sore throat, trouble swallowing and voice change.   Respiratory: Positive for shortness of breath. Negative for hemoptysis, sputum production and wheezing.   Cardiovascular: Negative for chest pain, orthopnea, claudication, leg swelling, syncope and PND.  Gastrointestinal: Negative for vomiting and abdominal pain.       No GERD  Musculoskeletal: Negative for neck pain.  Skin: Negative for rash.  Neurological: Negative for headaches.       Objective:   Physical Exam Filed Vitals:   05/25/14 1419  BP: 120/66  Pulse: 84  Height: $Remove'5\' 8"'[id number]$  (1.727 m)  Weight: 135 lb 9.6 oz (61.508 kg)  SpO2: 97%    Gen: Pleasant, thin cachectic white male   in no distress,  normal affect, pale in appearance  ENT: No lesions,  mouth clear,  oropharynx clear, no postnasal drip  Neck: No JVD, no TMG, no carotid bruits  Lungs: No use of accessory muscles, no dullness to percussion, bibasilar dry rales  Cardiovascular: RRR, heart sounds normal, no murmur or gallops, no peripheral edema  Abdomen: soft and NT, no HSM,  BS normal  Musculoskeletal: No deformities, no cyanosis or clubbing  Neuro: alert, non focal  Skin: Warm, no lesions or rashes  No results found.   Serial chest x-rays reviewed showed scarring at the bases with reduction in lung volumes No CT scan of chest is available for review No prior pulmonary function studies are available     Assessment & Plan:   Dyspnea and respiratory abnormality History of progressive exertional dyspnea without significant cough. Chest x-ray shows bibasilar atelectasis and scarring that has begun since the earliest film 1 record of 2004. There does appear to be progression on serial x-rays. No available CT scan of chest is seen. Etiology of this is unclear. The patient now has advanced prostate cancer metastatic to bone under therapy. The dyspnea and pulmonary fibrotic changes do not appear to be related to either the  prostate cancer or its treatment. Note on today's visit there is no exertional desaturation Plan An overnight sleep oxygen test will be obtained A lung function study will be obtained A CT of the Chest will be done when you get your abdominal CT scan REcords from DR Irine Seal will be obtained   Updated Medication List Outpatient Encounter Prescriptions as of 05/25/2014  Medication Sig  . amLODipine (NORVASC) 5 MG tablet Take 5 mg by mouth daily.   Marland Kitchen aspirin 81 MG tablet Take 81 mg by mouth daily.    Marland Kitchen atenolol (TENORMIN) 25 MG tablet Take 25 mg by mouth daily.    . Calcium Carbonate-Vitamin D (CALCIUM + D PO) Take 1 tablet by mouth 2 times daily at 12 noon and 4 pm. Cal 600 mg and Vit D 200 IU  . Cholecalciferol (VITAMIN D-3 PO) Take 2,000 Units by mouth daily.   . clopidogrel (PLAVIX) 75 MG tablet Take 75 mg by mouth daily.    Marland Kitchen denosumab (XGEVA) 120 MG/1.7ML SOLN ON HOLD  . diclofenac (FLECTOR) 1.3 % PTCH Place 1 patch onto the skin 2 (two) times daily. As needed  . fluticasone (FLONASE) 50 MCG/ACT nasal spray Place 2 sprays into the nose daily.   . Folic Acid-Vit R4-ERX V40 (FOLGARD) 0.8-10-0.115 MG TABS Take 1 tablet by mouth daily.    . hydroxypropyl methylcellulose (ISOPTO TEARS) 2.5 % ophthalmic solution Place 1 drop into both eyes daily.  . insulin glargine (LANTUS) 100 UNIT/ML injection Inject 3 Units  into the skin daily.   . insulin lispro (HUMALOG) 100 UNIT/ML injection Inject 1-4 Units into the skin 2 (two) times daily with a meal. Before breakfast and dinner.  Marland Kitchen ipratropium (ATROVENT) 0.03 % nasal spray Place 4 sprays into the nose daily.   Marland Kitchen ketoconazole (NIZORAL) 200 MG tablet TAKE 1 TABLET TWICE DAILY  . levocetirizine (XYZAL) 5 MG tablet Take 5 mg by mouth every evening.    Marland Kitchen levothyroxine (SYNTHROID, LEVOTHROID) 112 MCG tablet Take by mouth. Take 2 tablets daily, plus reports extra 1/2 tablet 3 times a week on empty stomach once a day.  . megestrol (MEGACE) 400 MG/10ML  suspension Take 10 mLs (400 mg total) by mouth 3 (three) times daily.  . NON FORMULARY Apply 1 application topically daily. As needed for back pain  . oxyCODONE-acetaminophen (PERCOCET) 7.5-325 MG per tablet Take 1 tablet by mouth 3 (three) times daily.   . pantoprazole (PROTONIX) 40 MG tablet Take 40 mg by mouth daily.    . predniSONE (DELTASONE) 5 MG tablet TAKE 1 TABLET EVERY other day  . simethicone (MYLICON) 557 MG chewable tablet Chew 125 mg by mouth 2 (two) times daily.   Marland Kitchen tetrahydrozoline 0.05 % ophthalmic solution Place 2 drops into both eyes daily.  . Triptorelin Pamoate (TRELSTAR DEPOT IM) Inject into the muscle every 3 (three) months.    . Trospium Chloride (SANCTURA XR PO) Take 60 mg by mouth daily before breakfast.   . valsartan-hydrochlorothiazide (DIOVAN-HCT) 320-25 MG per tablet Take 1 tablet by mouth daily.    . vitamin C (ASCORBIC ACID) 500 MG tablet Take 500 mg by mouth daily.    . [DISCONTINUED] predniSONE (DELTASONE) 5 MG tablet TAKE 1 TABLET EVERY DAY  . [DISCONTINUED] levothyroxine (SYNTHROID, LEVOTHROID) 200 MCG tablet Take by mouth.

## 2014-05-26 ENCOUNTER — Telehealth: Payer: Self-pay | Admitting: Oncology

## 2014-05-26 NOTE — Telephone Encounter (Signed)
returned call s/w pt wife. per wife 7/16 f/u moved to 7/17. wife has new d/t - also confirmed 7/14 lb/ct - lab moved to 1:30pm due to ct 2:30pm - wife aware.

## 2014-06-02 ENCOUNTER — Other Ambulatory Visit: Payer: Self-pay | Admitting: Oncology

## 2014-06-02 ENCOUNTER — Encounter: Payer: Self-pay | Admitting: Oncology

## 2014-06-02 NOTE — Telephone Encounter (Signed)
RECEIVED A FAX FROM [facility] CONCERNING A PRIOR AUTHORIZATION FOR MEGESTROL. THIS REQUEST WAS PLACED IN THE MANAGED CARE BIN. 

## 2014-06-02 NOTE — Progress Notes (Signed)
Faxed megestrol pa form to [facility]

## 2014-06-04 ENCOUNTER — Telehealth: Payer: Self-pay | Admitting: Critical Care Medicine

## 2014-06-04 ENCOUNTER — Encounter: Payer: Self-pay | Admitting: Oncology

## 2014-06-04 NOTE — Progress Notes (Signed)
[employer] approved megestrol acet 40mg /ml susp from 06/03/14-12/17/14

## 2014-06-04 NOTE — Telephone Encounter (Signed)
ONO on RA done on 05/30/14. Per PW: Call pt, let him no there is no need for o2.  Called, spoke with pt.  Informed him of above results per Dr. Joya Gaskins.  He verbalized understanding and voiced no further questions or concerns at this time.  Results placed in scan folder.

## 2014-06-05 ENCOUNTER — Telehealth: Payer: Self-pay | Admitting: Critical Care Medicine

## 2014-06-05 ENCOUNTER — Ambulatory Visit (INDEPENDENT_AMBULATORY_CARE_PROVIDER_SITE_OTHER): Payer: Medicare Other | Admitting: Pulmonary Disease

## 2014-06-05 ENCOUNTER — Ambulatory Visit (INDEPENDENT_AMBULATORY_CARE_PROVIDER_SITE_OTHER)
Admission: RE | Admit: 2014-06-05 | Discharge: 2014-06-05 | Disposition: A | Discharge status: Home / Self Care | Payer: Medicare Other | Source: Ambulatory Visit | Attending: Pulmonary Disease | Admitting: Pulmonary Disease

## 2014-06-05 ENCOUNTER — Encounter: Payer: Self-pay | Admitting: Pulmonary Disease

## 2014-06-05 VITALS — BP 108/68 | HR 88 | Temp 97.6°F | Ht 67.0 in | Wt 128.2 lb

## 2014-06-05 DIAGNOSIS — J849 Interstitial pulmonary disease, unspecified: Secondary | ICD-10-CM

## 2014-06-05 DIAGNOSIS — J8410 Pulmonary fibrosis, unspecified: Secondary | ICD-10-CM

## 2014-06-05 DIAGNOSIS — R0609 Other forms of dyspnea: Secondary | ICD-10-CM

## 2014-06-05 DIAGNOSIS — R0989 Other specified symptoms and signs involving the circulatory and respiratory systems: Secondary | ICD-10-CM

## 2014-06-05 MED ORDER — IOHEXOL 350 MG/ML SOLN
80.0000 mL | Freq: Once | INTRAVENOUS | Status: AC | PRN
  Administered 2014-06-05: 80 mL via INTRAVENOUS

## 2014-06-05 NOTE — Assessment & Plan Note (Signed)
The patient's dyspnea on exertion is probably secondary to his underlying interstitial lung disease, however I think we need to exclude the possibility of thromboembolic disease given his description of symptoms. The patient looks completely comfortable at rest with very good oxygen saturations and no accessory muscle use or increased work of breathing.  He tells me that he has had overnight oximetry, and did not qualify for nocturnal oxygen. However, I cannot find this in the chart. His symptoms are out of proportion to the objective findings, and I wonder whether a cardiac issue is contributing to his symptoms?  I have asked him to stay at home and not to do any significant exertional activities until we can sort through all of this.

## 2014-06-05 NOTE — Telephone Encounter (Signed)
Called spoke w/ spouse. Appt scheduled to come in and see [place] this afternoon at 2:45 for acute visit

## 2014-06-05 NOTE — Progress Notes (Signed)
   Subjective:    Patient ID: Jackson Mccormick, male    DOB: November 07, 1928, 80 y.o.   MRN: [id number]  HPI The patient comes in today for an acute sick visit. He is been seen by Dr. Joya Gaskins for dyspnea on exertion, and felt to possibly have interstitial lung disease. He is in the process of his workup, with a CT chest and PFTs pending. He comes in today where he has had a few day history of worsening shortness of breath, to the point that he will get very winded just walking through his house. He complains of dyspnea at night when lying flat, and has known coronary artery disease. Apparently he has had overnight oximetry which did not show significant desaturation according to the patient's wife. The patient is complaining of dyspnea at rest, but he looks very comfortable in the office with sats 97%. He denies chest congestion, or cough with mucus production.   Review of Systems  Constitutional: Negative for fever and unexpected weight change.  HENT: Negative for congestion, dental problem, ear pain, nosebleeds, postnasal drip, rhinorrhea, sinus pressure, sneezing, sore throat and trouble swallowing.   Eyes: Negative for redness and itching.  Respiratory: Positive for shortness of breath. Negative for cough, chest tightness and wheezing.   Cardiovascular: Negative for palpitations and leg swelling.  Gastrointestinal: Negative for nausea and vomiting.  Genitourinary: Negative for dysuria.  Musculoskeletal: Negative for joint swelling.  Skin: Negative for rash.  Neurological: Negative for headaches.  Hematological: Does not bruise/bleed easily.  Psychiatric/Behavioral: Negative for dysphoric mood. The patient is not nervous/anxious.        Objective:   Physical Exam Frail-appearing male in no acute distress Nose without purulence or discharge noted Neck without lymphadenopathy or thyromegaly Chest with crackles one third the way up bilaterally, no active wheezing Cardiac exam with regular rate  and rhythm, 3/6 systolic murmur Lower extremities with mild edema, no cyanosis Alert and oriented, moves all 4 extremities.       Assessment & Plan:

## 2014-06-05 NOTE — Assessment & Plan Note (Signed)
The patient has classic subpleural fibrosis with honeycombing on a CT abdomen from 2013. I suspect that he does have usual interstitial pneumonitis, and given his cutaneous findings on exam, I am concerned about the possibility of scleroderma. He will need a CT of his chest for diagnosis, and will also do a CT angiogram to rule out pulmonary embolus given his worsening symptoms.

## 2014-06-05 NOTE — Patient Instructions (Signed)
Stay at home and do not do anything strenuous. Will schedule for scan of chest to evaluate your scarring, and also to check for blood clots. If your breathing worsens to the point that you feel much worse even at rest, would recommend going to emergency room.  Will call with the results of the scan.

## 2014-06-09 ENCOUNTER — Encounter: Payer: Self-pay | Admitting: Cardiology

## 2014-06-09 ENCOUNTER — Telehealth: Payer: Self-pay | Admitting: Critical Care Medicine

## 2014-06-09 DIAGNOSIS — R0689 Other abnormalities of breathing: Principal | ICD-10-CM

## 2014-06-09 DIAGNOSIS — R0600 Dyspnea, unspecified: Secondary | ICD-10-CM

## 2014-06-09 NOTE — Progress Notes (Signed)
Patient ID: Jackson Mccormick, male   DOB: 04-Jun-1928, 80 y.o.   MRN: [id number]   Jackson Mccormick  Date of visit:  06/09/2014 DOB:  06/28/1928    Age:  66 yrs. Medical record number:  [id number]     Account number:  [id number] Primary Care Provider: Lorne Skeens Mccormick ____________________________ CURRENT DIAGNOSES  1. Idiopathic Pulmonary Fibrosis  2. Loss Of Weight  3. CAD,Native  4. Cardiomyopathy Ischemic  5. Surgery-Aortocoronary Bypass Grafting  6. Stent Placement  7. Hypertension-Essential (Benign)  8. Mi-s/p Anterior  9. Diabetes Mellitus-IDD/circ dis/controll  10. Hyperlipidemia  11. Edema, unspecified  12. Personal History Of Malignant Neoplasm Of Prostate ____________________________ ALLERGIES  Morphine, Rash ____________________________ MEDICATIONS  1. Ipratropium Bromide Nasal 0.06%, 2 spray each nostril q am  2. fluticasone 50 mcg/actuation Disk with Device, 2 sprays each nostril hs  3. Diovan HCT 320-25 mg tablet, 1 p.o. daily  4. Folgard 0.8-10-115 mg-mg-mcg tablet, 1 p.o. q.Mccormick.  5. Humalog 100 unit/mL Solution, sl scale bid  6. Vitamin C 500 mg tablet, 1 p.o. q.Mccormick.  7. Vitamin D3 400 unit capsule, 1 p.o. daily  8. Gas-X Extra Strength 125 mg Tablet, Chewable, TID  9. Pantoprazole 40 mg Tablet, Delayed Release (E.C.), 1 p.o. daily  10. levocetirizine 5 mg Tablet, 1 p.o. daily  11. aspirin 81 mg Tablet, Chewable, 1 p.o. q.Mccormick.  12. trospium 60 mg capsule,extended release 24hr, 1 p.o. daily  13. ketoconazole 200 mg tablet, BID  14. Saline Solution Solution, BID  15. nitroglycerin 0.4 mg tablet, sublingual, PRN  16. Synthroid 112 mcg tablet, 2 p.o. daily  17. Flector 1.3 % patch 12 hour, PRN  18. Calcium 600 + Mccormick(3) 600-125 mg-unit tablet, TID  19. Percocet 7.5-325 mg tablet, BID  20. atenolol 25 mg tablet, 1 p.o. daily  21. prednisolone 5 mg tablet, qod  22. clopidogrel 75 mg tablet, 1 p.o. daily  23. Synthroid 112 mcg tablet, 2 tabs daily  24. Synthroid 112 mcg tablet,  1/2  3x week  25. Lexapro 10 mg tablet, 1 p.o. daily  26. megestrol 400 mg/10 mL (40 mg/mL) oral suspension, $RemoveBeforeDE'400mg'[id number]$  bid  27. Hair,Skin with Biotin 7.5 mg-7.5 unit-1,250 mcg chewable tablet, TID  28. Lantus 100 unit/mL subcutaneous solution, 4u qd ____________________________ CHIEF COMPLAINTS  Followup of CAD,Native  Followup of Cardiomyopathy Ischemic ____________________________ HISTORY OF PRESENT ILLNESS Patient seen for cardiac evaluation. He comes in today brought by his wife in a wheelchair and really looks quite ill. He has weakened a great bit since he was here. He has now lost 26 pounds since he was here in December and really does not have much of an appetite. He has not really had any angina. He has a mild amount of edema but has become weak and is somewhat foggy mentally now as before when he was usually quite sharp. He has had significant back pain and has been seeing a person in the [facility] and also continues under treatment for metastatic prostate cancer. I thought he had pulmonary fibrosis December and he eventually saw Dr. Asencion Mccormick and eventually had to see Dr. Gwenette Mccormick in the acute clinic and had a CT angiogram done 4 days ago that showed progressive pulmonary fibrosis and no evidence of pulmonary emboli. He evidently is severely dyspneic with exertion. He does not have PND or  orthopnea and did not have any nocturnal desaturations. He is supposed to have a CT scan of his abdomen at some point. His wife  asked if he would be a candidate for palliative care and we discussed this in detail today. ____________________________ PAST HISTORY  Past Medical Illnesses:  hypertension, DM-insulin dependent, hyperlipidemia, hypothyroidism, spinal stenosis, obstructed duodenal ulcer 8/08, prostate cancer met to bone 11/10 tx hormonally;  Cardiovascular Illnesses:  CAD, S/P MI-anterior 1992;  Surgical Procedures:  CABG w LIMA to LAD, SVG to AM-PD-PL, SVG to OM 01/09/97 Dr. Redmond Mccormick,  amputation of finger, knee surgery, left May 2005, vagotomy and gastrojejunostomy 11/09 Dr. Dalbert Mccormick;  Cardiology Procedures-Invasive:  cardiac cath (left), PTCA of the LAD Diag 1 1992, Taxus stent RCA May 2004 SVG;  Cardiology Procedures-Noninvasive:  adenosine cardiolite June 2009, echocardiogram July 2012;  Cardiac Cath Results:  50% stenosis Left main, 90% stenosis proximal LAD, 90% stenosis mid, 80% stenosis Ramus, 60% stenosis mid RCA, 90% stenosis proximal RCA, 99% stenosis PLR;  LVEF of 35% documented via echocardiogram on 05/07/2013,   ____________________________ CARDIO-PULMONARY TEST DATES EKG Date:  04/21/2013;   Cardiac Cath Date:  04/30/2003;  CABG: 01/09/1997;  Stent Placement Date: 05/07/2003;  Nuclear Study Date:  06/11/2008;  Echocardiography Date: 05/07/2013;  Chest Xray Date: 04/21/2013;   ____________________________ FAMILY HISTORY Brother -- Brother dead, Malignant neoplasm of liver, Diabetes mellitus Brother -- Brother dead, Carcinoma of the pancreas, Parkinsonism Brother -- Brother dead, CVA Father -- Father dead, CVA, Deceased Mother -- Mother dead, Diabetes mellitus, Cancer, Deceased ____________________________ SOCIAL HISTORY Alcohol Use:  drinks occasionally;  Smoking:  used to smoke but quit Prior to 1980;  Diet:  regular diet without modifications;  Lifestyle:  married;  Exercise:  exercise is limited due to physical disability;  Occupation:  retired and Ever Nurse, learning disability;  Residence:  lives with wife;   ____________________________ REVIEW OF SYSTEMS General:  weight loss  Integumentary:easy bruisability Respiratory: dyspnea with exertion Cardiovascular:  please review HPI Abdominal: denies dyspepsia, GI bleeding, constipation, or diarrhea Genitourinary-Male: frequency, incontinence  Musculoskeletal:  arthritis of the left knee, severe chronic low back pain, arthritis of the right shoulder, edema and chronic venous disease  ____________________________ PHYSICAL  EXAMINATION VITAL SIGNS  Blood Pressure:  118/60 Sitting, Left arm, regular cuff   Pulse:  64/min. Weight:  127.00 lbs. Height:  70"BMI: 18  Constitutional:  cachectic, frail white male in wheelchair Skin:  scattered ecchymosis present Head:  normocephalic, normal hair pattern, no masses or tenderness Eyes:  EOMS Intact, PERRLA, C and S clear, Funduscopic exam not done. ENT:  ears, nose and throat reveal no gross abnormalities.  Dentition good. Neck:  supple, no masses, thyromegaly, JVD. Carotid pulses are full and equal bilaterally without bruits. Chest:  healed median sternotomy scar, Velcro crackles both  lungs 1/3 way up Cardiac:  regular rhythm, normal S1 and S2, no S3 or S4, grade 2/6 systolic murmur Abdomen:  abdomen soft,non-tender, no masses, no hepatospenomegaly, or aneurysm noted Peripheral Pulses:  the femoral,dorsalis pedis, and posterior tibial pulses are full and equal bilaterally with no bruits auscultated. Extremities & Back:  well healed saphenous vein donor site RLE, 1+ edema Neurological:  no gross motor or sensory deficits noted, affect appropriate, oriented x3. ____________________________ MOST RECENT LIPID PANEL 02/24/14  CHOL TOTL 63 mg/dl, LDL 23 NM, HDL 31 mg/dl and TRIGLYCER 46 mg/dl ____________________________ IMPRESSIONS/PLAN  1. Progressive failure with significant weight loss and malnutrition today 2. Pulmonary fibrosis which is progressive 3. Ischemic cardiomyopathy 4. Failure to thrive 5. Metastatic prostate cancer  Recommendations:  Telephone conversations with Dr. Elyse Hsu and Dr. Normajean Baxter today. He does have a followup pulmonary appointment  to discuss the significance of the pulmonary fibrosis. I did not order a repeat echocardiogram on him today but would be willing to do one if he feels it would make a clinical difference. He has a number of significant illnesses that are coming together now and the weight loss is really quite concerning.  Depending on what the pulmonary doctor says I think that palliative care would be a reasonable option to pursue and his wife has brought this up with Dr. Elyse Hsu previously as well as myself today . His other cardiac medicine appears to be stable. I will plan to see him in 6 months but will always see him sooner if need be. ____________________________ TODAYS ORDERS  1. Return Visit: 6 months                       ____________________________ Cardiology Physician:  Kerry Hough MD [facility]

## 2014-06-09 NOTE — Telephone Encounter (Signed)
Tell pt ONO was NORMAL, no oxygen needed

## 2014-06-09 NOTE — Telephone Encounter (Signed)
Called, spoke with pt's spouse -  Informed her of ONO results and recs per PW.  She verbalized understanding, will information pt, and voiced no further questions or concerns at this time.  She will have pt call back if he has any further questions or concerns.

## 2014-06-14 ENCOUNTER — Telehealth: Payer: Self-pay | Admitting: Oncology

## 2014-06-14 NOTE — Telephone Encounter (Signed)
Talked to pt gave her appt for July r/s due to call , mailed appt

## 2014-06-17 ENCOUNTER — Telehealth: Payer: Self-pay | Admitting: Pulmonary Disease

## 2014-06-17 ENCOUNTER — Encounter: Payer: Self-pay | Admitting: Critical Care Medicine

## 2014-06-17 DIAGNOSIS — R0689 Other abnormalities of breathing: Secondary | ICD-10-CM

## 2014-06-17 DIAGNOSIS — R0600 Dyspnea, unspecified: Secondary | ICD-10-CM

## 2014-06-17 DIAGNOSIS — J849 Interstitial pulmonary disease, unspecified: Secondary | ICD-10-CM

## 2014-06-17 NOTE — Telephone Encounter (Signed)
Called spoke with spouse who reported that pt has been having increased SOB x2 weeks, more pronounced today.  He had been walking from room to room w/ his cane and some dyspnea, but recovered after resting a few minutes.  Today, the dyspnea has been "all day long" and continuous and pt has not felt comfortable walking w/ his cane and has been using a wheelchair w/in the home.  The office visit at the pain was his routine monthly visit for pain meds and per Pamala Hurry, they had mentioned he was "87" that she interpreted at 80% oxygen saturation - pt does not have an oximeter at home.  Spouse denies any wheezing, tightness, cough, hemoptysis, f/c/s/n/v.  Pamala Hurry has the contact name/number of Bonney Roussel @ [phone] - per Tyna Jaksch had asked if they wanted someone to come to the home before or after the holiday but spouse opted for after the holiday.  Advised Pamala Hurry will call Misty to see what can be done and call her back.  Called Misty at the number above and provided the info given by pt's spouse.  Misty stated she will speak with her Medical Director whom was scheduled to see pt next week to see if she can see pt tomorrow.  Misty given the triage number and my name to ask for.  Misty called again and stated that the Medical Director could actually see pt this afternoon in the next 30-82mins if pt is able.  Asked Misty to hold while I call the spouse and check >> called spoke with Pamala Hurry and explained that someone could come to the home shortly to evaluate pt.  Patient and spouse okay with this.  Hung up with Pamala Hurry and advised Misty that this afternoon will work for pt and spouse for Hospice/Palliative Care evaluation.  Nothing further needed at this time.  Will sign and forward to PW as FYI.

## 2014-06-17 NOTE — Telephone Encounter (Signed)
Pls get hospice out asap to see and admit this pt

## 2014-06-17 NOTE — Telephone Encounter (Signed)
Called and spoke with yvette with hospice and she has taken the information on the pt and she will try and get someone out today to admit the pt into hospice service.

## 2014-06-17 NOTE — Addendum Note (Signed)
Addended by: Elie Confer on: 06/17/2014 04:54 PM   Modules accepted: Orders

## 2014-06-17 NOTE — Telephone Encounter (Signed)
PT's wife states that his breathing is getting worse.  Hospice is due to come out next week to evaluate pt.  Wife wants to know if pt would be more comfortable with oxygen during the day.  She reports that his levels were in the 80's at the pain clinic last week.  Sending to doc of day.  Please advise.

## 2014-06-17 NOTE — Telephone Encounter (Signed)
Please call hospice and let them know pt's breathing is getting worse, and see if they can see him this week if possible?  If not, we can consider starting on MSIR.  See what they say about seeing sooner.

## 2014-06-18 ENCOUNTER — Telehealth: Payer: Self-pay | Admitting: Critical Care Medicine

## 2014-06-18 NOTE — Telephone Encounter (Signed)
PFT order and appt cancelled. Dr. Joya Gaskins, pt has a pending OV with you on July 16.  Would you like him to keep this appt or cancel it?

## 2014-06-18 NOTE — Telephone Encounter (Signed)
i spoke to the pts spouse  She is in agreement with hospice now Dr altheimer signed primary orders but i am available for any ?s pulm wise on his hospice care Please cancel PFTs order

## 2014-06-18 NOTE — Telephone Encounter (Signed)
Dr Redge Gainer (the Medical Director for [facility] of the [facility]) calling to state that she did go to pt's home yesterday evening to evaluate pt and recommended that he be admitted to their services.  Per "Dr Carin Hock", pt's spouse declined admit last night and asked that Dr Carin Hock return today for admitting.  Dr Carin Hock stated that she is needing a verbal order to admit patient to Hospice&Palliative Care.  Advised Dr Carin Hock that per the 7.1.15 phone note, PW had requested an ASAP eval by Hospice for admission.  Dr Carin Hock took this as a verbal order.  Nothing further needed at this time, will sign off.  Per the 7.1.15 phone note: Jackson Stain, MD at 06/17/2014 4:20 PM     Pls get hospice out asap to see and admit this pt     Will again sign and forward to PW as FYI that pt is now admitted to and under the service of [facility].

## 2014-06-18 NOTE — Telephone Encounter (Signed)
Cancel this appt  He is too weak to come in for OV  He is now in hospice

## 2014-06-18 NOTE — Telephone Encounter (Signed)
Yvette w/ [facility] called again Spoke with Dewaine Oats who stated that she had received an order yesterday 7.1.15 to admit pt to [facility] > she had called pt yesterday evening at about 5pm and [facility] [place] was already there seeing the patient as per the 7.1.15 phone note.  Dewaine Oats stated this is an FYI and nothing is needed.  Will sign off.

## 2014-06-18 NOTE — Telephone Encounter (Signed)
appt has been cancelled with PW on 7/16 and family is aware.

## 2014-06-18 NOTE — Telephone Encounter (Signed)
noted 

## 2014-06-30 ENCOUNTER — Ambulatory Visit (HOSPITAL_COMMUNITY): Payer: Medicare Other

## 2014-06-30 ENCOUNTER — Other Ambulatory Visit: Payer: Medicare Other

## 2014-07-02 ENCOUNTER — Ambulatory Visit: Payer: Medicare Other | Admitting: Critical Care Medicine

## 2014-07-02 ENCOUNTER — Ambulatory Visit: Payer: Medicare Other | Admitting: Oncology

## 2014-07-03 ENCOUNTER — Ambulatory Visit: Payer: Medicare Other | Admitting: Oncology

## 2014-07-07 ENCOUNTER — Ambulatory Visit: Payer: Medicare Other | Admitting: Oncology

## 2014-08-03 ENCOUNTER — Telehealth: Payer: Self-pay | Admitting: Critical Care Medicine

## 2014-08-03 NOTE — Telephone Encounter (Signed)
Will forward to PW as an FYI 

## 2014-08-13 NOTE — Telephone Encounter (Signed)
Noted and thank you for call

## 2014-08-18 DEATH — deceased

## 2015-01-05 IMAGING — CT CT ANGIO CHEST
3 of 9 series · 18 of 36 positions shown · IV contrast (Omnipaque 300)
Comparison: CT Abdomen and Pelvis 05/14/2012 and earlier.

CLINICAL DATA: 86-year-old male with worsening shortness of breath.
Initial encounter. Known interstitial lung disease. History of
prostate cancer diagnosed in 1188 with metastatic bone disease.

EXAM:
CT ANGIOGRAPHY CHEST WITH CONTRAST
TECHNIQUE: Multidetector CT imaging of the chest was performed using the
standard protocol during bolus administration of intravenous
contrast. Multiplanar CT image reconstructions and MIPs were
obtained to evaluate the vascular anatomy.
CONTRAST:  100 mL Omnipaque 300.

[Series 8: thins (id) / (id) · axial · 0.65mm/px · z∈[-197,+31]mm · 14 of 264 slices shown]
[im 18/264  lung]
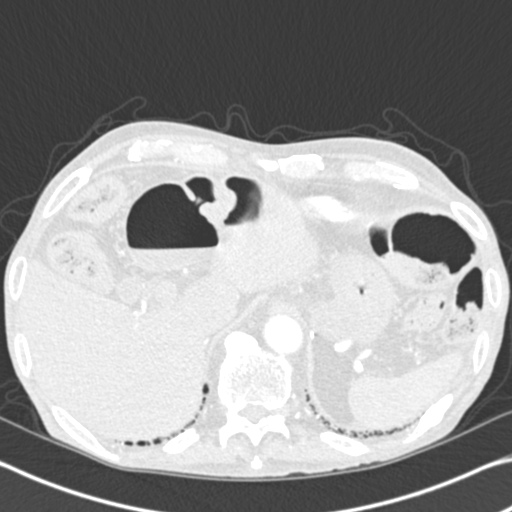
[im 36/264  mediastinal]
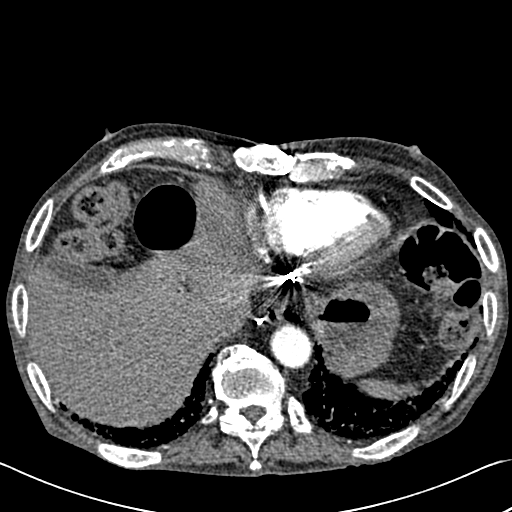
[im 53/264  lung]
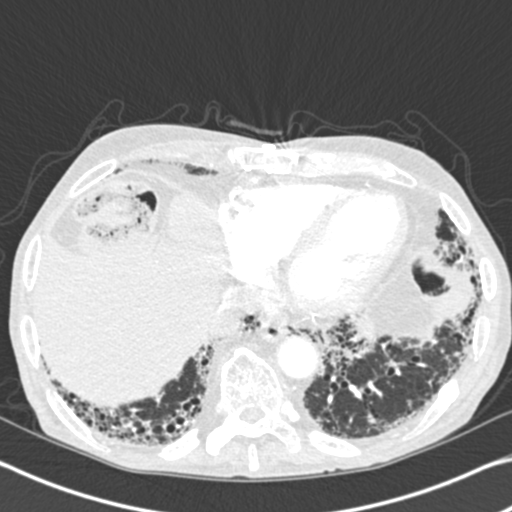
[im 71/264  mediastinal]
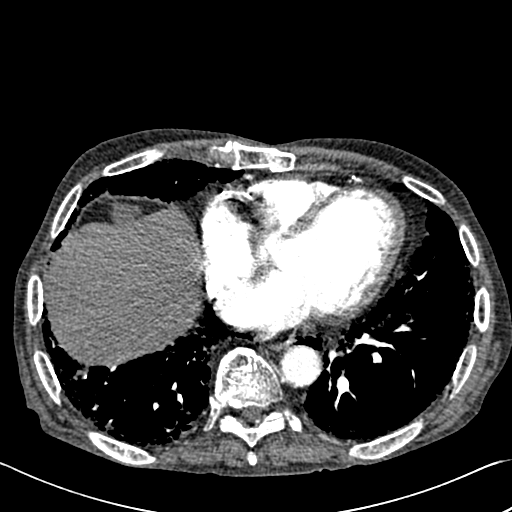
[im 88/264  lung]
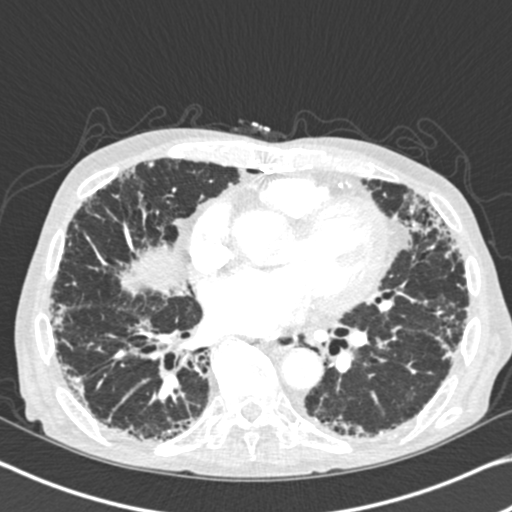
[im 106/264  mediastinal]
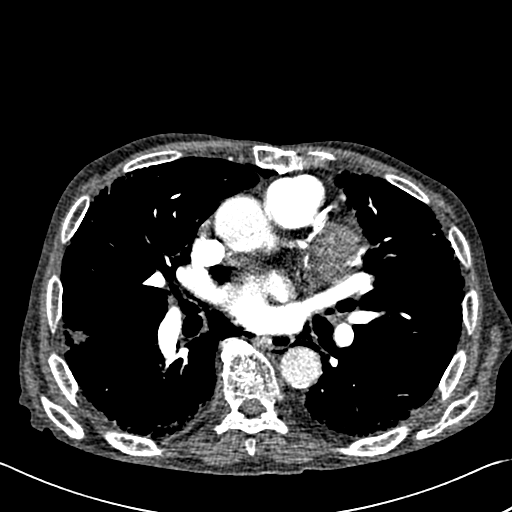
[im 123/264  lung]
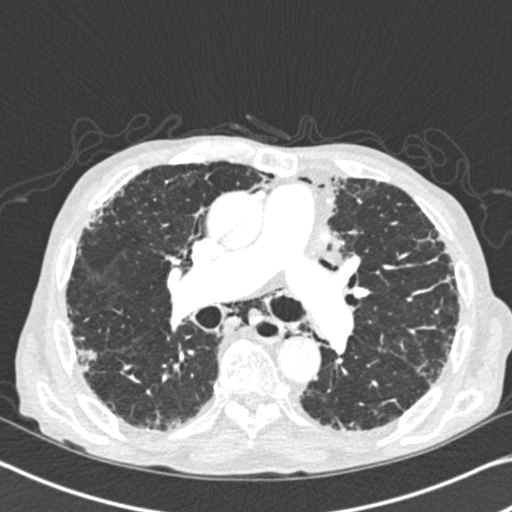
[im 141/264  mediastinal]
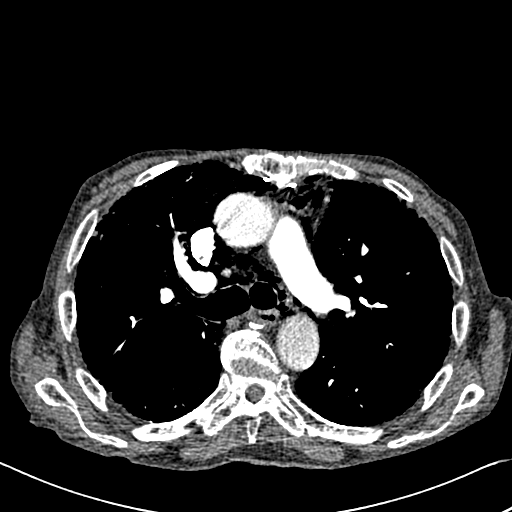
[im 158/264  lung]
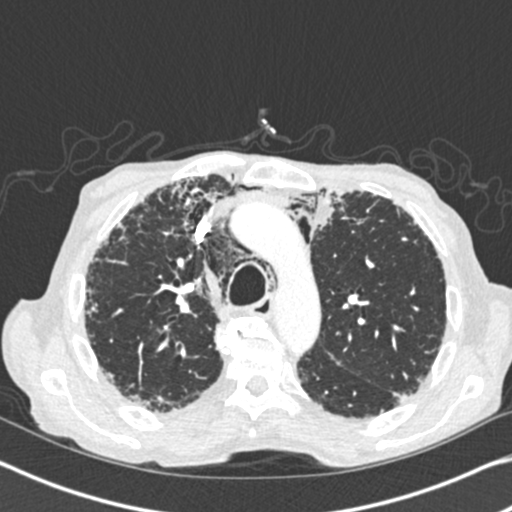
[im 176/264  mediastinal]
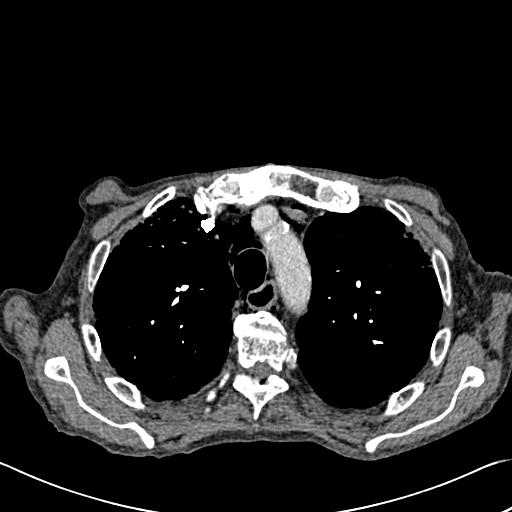
[im 193/264  lung]
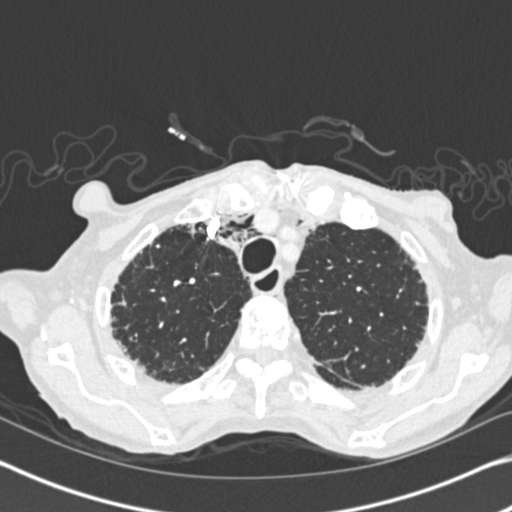
[im 211/264  mediastinal]
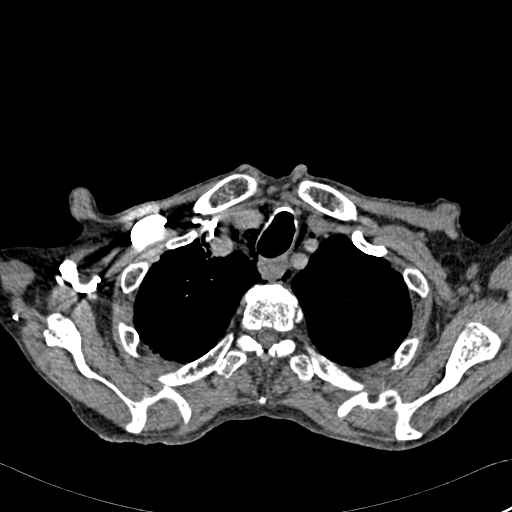
[im 228/264  lung]
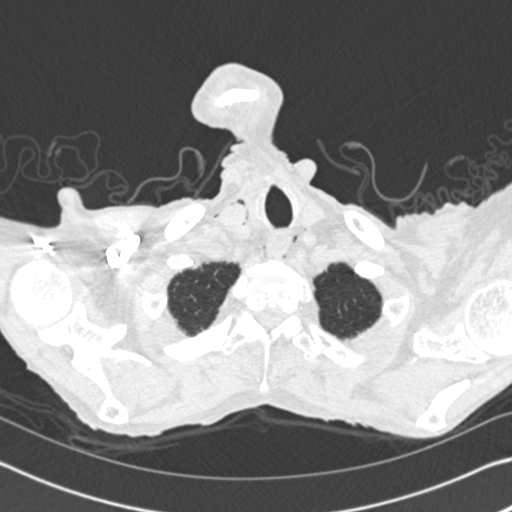
[im 246/264  mediastinal]
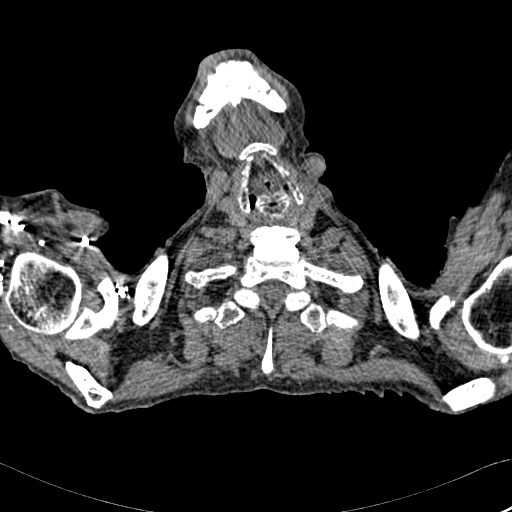

[Series 9: lung (id) / (id) · axial · 0.65mm/px · z∈[-134,-14]mm · 3 of 80 slices shown]
[im 20/80  mediastinal]
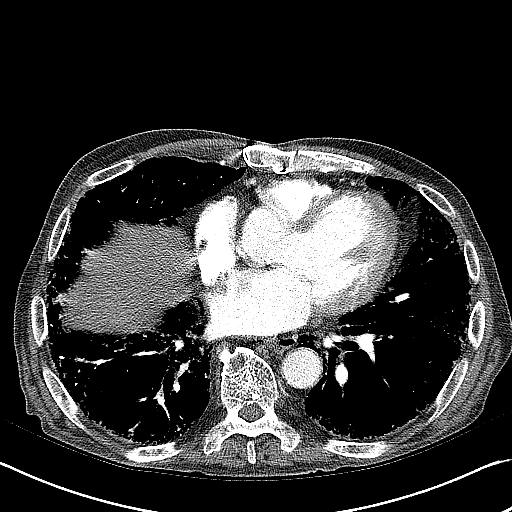
[im 40/80  mediastinal]
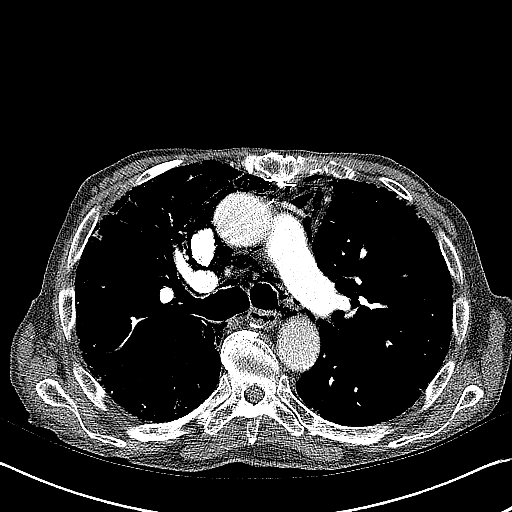
[im 60/80  mediastinal]
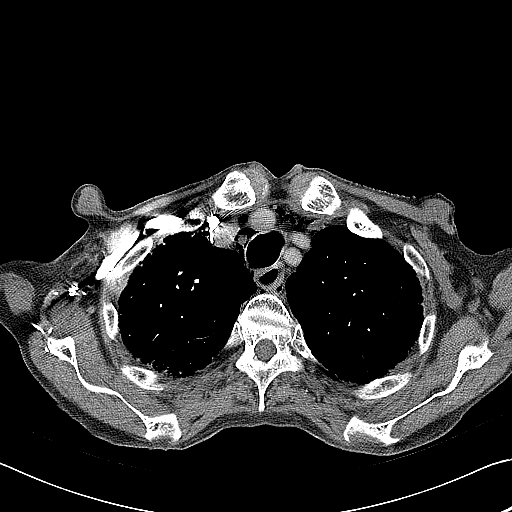

[Series 602: cor mpr · coronal · 0.65mm/px · 1 of 102 slices shown]
[im 51/102  mediastinal]
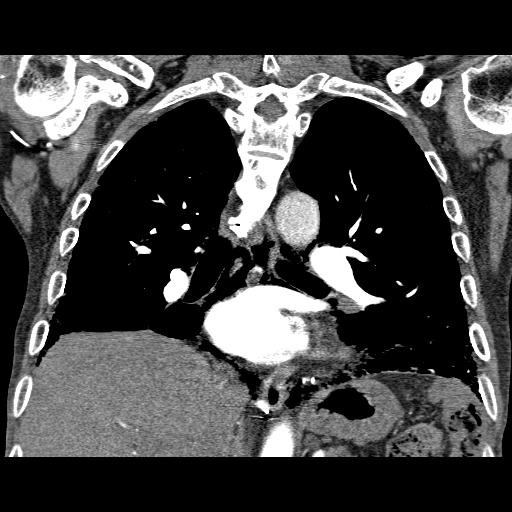

[18 of 36 positions shown; findings below may reference images not displayed]

FINDINGS: Good contrast bolus timing in the pulmonary arterial tree.

No focal filling defect identified in the pulmonary arterial tree to
suggest the presence of acute pulmonary embolism.

No pleural effusion. No pericardial effusion. Cardiomegaly with
aortic and coronary artery calcified atherosclerosis. Visualized
aorta is patent without dissection or aneurysm. In the visualized
upper abdomen, no pneumoperitoneum or free fluid. Visualized upper
abdominal viscera appear stable since 9692.

Abnormal moderate volume pneumomediastinum, tracking to the thoracic
inlet and starting to track into the right carotid space of the
lower neck. No associated pneumothorax (see lung findings below).
Major airways are patent, and somewhat bronchiectatic throughout. No
esophageal thickening or irregularity. No mediastinal fluid. No
pleural fluid.

Pulmonary honeycombing which has significantly progressed since that
seen in 9692 (compare the left lung base on series 9, image 68 today
to series 4, image 17 in 9692). No pulmonary consolidation.
Inspiratory and expiratory views were obtained. No gas trapping
identified.

Mid and lower thoracic vertebrae appear stable since 9692, including
multiple small sclerotic foci in the bones. Larger sclerotic foci
are present in the C7 vertebra, T4 vertebral body, and in the right
sternum and manubrium. There is sclerosis of the inferior T1
vertebral endplate which is mildly to moderately compressed. This
has a chronic appearance. Chronic left posterior lateral ninth and
tenth rib fractures are again noted and might be pathologic. There
is a sclerotic metastasis in the anterior left first rib, the
posterior right second and third ribs, and in the right T10
transverse process. No extraosseous or intraspinal tumor identified.

Review of the MIP images confirms the above findings.
IMPRESSION: 1.  No evidence of acute pulmonary embolus.
2. Progressive pulmonary fibrosis with honeycombing since 9692.
Superimposed moderate volume pneumomediastinum, such as can be seen
with forceful coughing or Valsalva. No pneumothorax or pleural
effusion. No pneumoperitoneum or free fluid in the upper abdomen.
3. Chronic osseous metastatic disease related to metastatic prostate
cancer.
Study discussed by telephone with Dr. ALEX MILTON LOP on 06/05/2014 at
